# Patient Record
Sex: Male | Born: 1935 | Race: White | Hispanic: No | Marital: Married | Smoking: Never smoker
Health system: Southern US, Community
[De-identification: ages and names within clinical notes are randomized; demographics above are authoritative.]

## PROBLEM LIST (undated history)

## (undated) DIAGNOSIS — M19079 Primary osteoarthritis, unspecified ankle and foot: Secondary | ICD-10-CM

## (undated) DIAGNOSIS — G250 Essential tremor: Secondary | ICD-10-CM

## (undated) DIAGNOSIS — F419 Anxiety disorder, unspecified: Secondary | ICD-10-CM

## (undated) DIAGNOSIS — I2510 Atherosclerotic heart disease of native coronary artery without angina pectoris: Secondary | ICD-10-CM

## (undated) DIAGNOSIS — E785 Hyperlipidemia, unspecified: Secondary | ICD-10-CM

## (undated) DIAGNOSIS — G20 Parkinson's disease: Secondary | ICD-10-CM

## (undated) DIAGNOSIS — N400 Enlarged prostate without lower urinary tract symptoms: Secondary | ICD-10-CM

## (undated) DIAGNOSIS — M810 Age-related osteoporosis without current pathological fracture: Secondary | ICD-10-CM

## (undated) DIAGNOSIS — G20A1 Parkinson's disease without dyskinesia, without mention of fluctuations: Secondary | ICD-10-CM

## (undated) HISTORY — DX: Essential tremor: G25.0

## (undated) HISTORY — DX: Benign prostatic hyperplasia without lower urinary tract symptoms: N40.0

## (undated) HISTORY — DX: Hyperlipidemia, unspecified: E78.5

## (undated) HISTORY — PX: TONSILLECTOMY AND ADENOIDECTOMY: SUR1326

## (undated) HISTORY — PX: CARPAL TUNNEL RELEASE: SHX101

## (undated) HISTORY — DX: Anxiety disorder, unspecified: F41.9

## (undated) HISTORY — DX: Atherosclerotic heart disease of native coronary artery without angina pectoris: I25.10

## (undated) HISTORY — PX: CATARACT EXTRACTION: SUR2

## (undated) HISTORY — DX: Parkinson's disease without dyskinesia, without mention of fluctuations: G20.A1

## (undated) HISTORY — PX: CHOLECYSTECTOMY: SHX55

## (undated) HISTORY — DX: Age-related osteoporosis without current pathological fracture: M81.0

## (undated) HISTORY — DX: Primary osteoarthritis, unspecified ankle and foot: M19.079

## (undated) HISTORY — DX: Parkinson's disease: G20

---

## 1976-07-19 HISTORY — PX: OTHER SURGICAL HISTORY: SHX169

## 1997-07-19 HISTORY — PX: JOINT REPLACEMENT: SHX530

## 2006-12-27 ENCOUNTER — Ambulatory Visit: Payer: Self-pay | Admitting: Internal Medicine

## 2006-12-27 ENCOUNTER — Other Ambulatory Visit: Payer: Self-pay

## 2007-05-28 ENCOUNTER — Other Ambulatory Visit: Payer: Self-pay

## 2007-05-28 ENCOUNTER — Inpatient Hospital Stay: Payer: Self-pay | Admitting: Internal Medicine

## 2007-06-06 ENCOUNTER — Ambulatory Visit: Payer: Self-pay | Admitting: General Surgery

## 2007-06-08 ENCOUNTER — Ambulatory Visit: Payer: Self-pay | Admitting: General Surgery

## 2007-11-18 ENCOUNTER — Emergency Department: Payer: Self-pay | Admitting: Emergency Medicine

## 2008-07-19 DIAGNOSIS — I2510 Atherosclerotic heart disease of native coronary artery without angina pectoris: Secondary | ICD-10-CM

## 2008-07-19 HISTORY — DX: Atherosclerotic heart disease of native coronary artery without angina pectoris: I25.10

## 2008-09-16 HISTORY — PX: CORONARY STENT PLACEMENT: SHX1402

## 2009-02-26 ENCOUNTER — Ambulatory Visit: Payer: Self-pay | Admitting: Internal Medicine

## 2009-09-13 ENCOUNTER — Emergency Department: Payer: Self-pay | Admitting: Emergency Medicine

## 2010-07-19 DIAGNOSIS — M810 Age-related osteoporosis without current pathological fracture: Secondary | ICD-10-CM

## 2010-07-19 HISTORY — DX: Age-related osteoporosis without current pathological fracture: M81.0

## 2012-03-17 ENCOUNTER — Ambulatory Visit (INDEPENDENT_AMBULATORY_CARE_PROVIDER_SITE_OTHER): Payer: Medicare Other | Admitting: Internal Medicine

## 2012-03-17 ENCOUNTER — Encounter: Payer: Self-pay | Admitting: Internal Medicine

## 2012-03-17 VITALS — BP 128/70 | HR 68 | Temp 98.4°F | Ht 66.5 in | Wt 173.0 lb

## 2012-03-17 DIAGNOSIS — M79609 Pain in unspecified limb: Secondary | ICD-10-CM

## 2012-03-17 DIAGNOSIS — E785 Hyperlipidemia, unspecified: Secondary | ICD-10-CM | POA: Insufficient documentation

## 2012-03-17 DIAGNOSIS — N400 Enlarged prostate without lower urinary tract symptoms: Secondary | ICD-10-CM | POA: Insufficient documentation

## 2012-03-17 DIAGNOSIS — M79672 Pain in left foot: Secondary | ICD-10-CM | POA: Insufficient documentation

## 2012-03-17 DIAGNOSIS — M19079 Primary osteoarthritis, unspecified ankle and foot: Secondary | ICD-10-CM | POA: Insufficient documentation

## 2012-03-17 DIAGNOSIS — F411 Generalized anxiety disorder: Secondary | ICD-10-CM

## 2012-03-17 DIAGNOSIS — F419 Anxiety disorder, unspecified: Secondary | ICD-10-CM | POA: Insufficient documentation

## 2012-03-17 DIAGNOSIS — Z23 Encounter for immunization: Secondary | ICD-10-CM

## 2012-03-17 DIAGNOSIS — I2510 Atherosclerotic heart disease of native coronary artery without angina pectoris: Secondary | ICD-10-CM | POA: Insufficient documentation

## 2012-03-17 DIAGNOSIS — G20 Parkinson's disease: Secondary | ICD-10-CM

## 2012-03-17 NOTE — Assessment & Plan Note (Signed)
On meds Will check his records before doing any labs

## 2012-03-17 NOTE — Progress Notes (Signed)
Subjective:    Patient ID: [name] Rich, male    DOB: February 27, 1929, 78 y.o.   MRN: [id number]  HPI Establishing with me Wife recently started in my care Had been seeing Dr Terance Hart Is [facility] resident  Parkinson's disease diagnosed ~2001 Sees Dr Kennyth Arnold at [facility] to manage all personal care and some help in house  Now limited in activity by problems with left foot problems Podiatrist wanted to operate but he deferred---diagnosed with arthrits (Dr Charlsie Merles) Pain with walking--now going for PT Has celebrex but hasn't been taking---concerned about side effects Has been able to use the stationary bike at [facility]  Has CAD Diagnosed 8 years ago or so Cath done 3/10 with stent. Follow up 8/10 showed stability Dr Gwen Pounds follows Stable exertional angina though ranexa does help No CHF  Has BPH since before 10 years ago Has been on terazosin all that time Occ dizziness and sleepiness No syncope but concerned about hypotension Isosorbide has been decreased due to concerns about low BP Can be as low as 90/46 in AM  Has been on venlafaxine since Parkinson's diagnosis He doesn't feel that he is really depressed Original treating doctor felt he probably would have anxiety Now doesn't take it over once a week Labile emotions in past with easy crying but not depressed  Rx for high cholesterol No problems with the med  Current Outpatient Prescriptions on File Prior to Visit  Medication Sig Dispense Refill  . carbidopa-levodopa (SINEMET CR) 25-100 MG per tablet Take 1.5 tablets by mouth 4 (four) times daily.      . entacapone (COMTAN) 200 MG tablet Take 200 mg by mouth 4 (four) times daily.      . isosorbide dinitrate (ISORDIL) 30 MG tablet Take 30 mg by mouth once a week.      . lovastatin (MEVACOR) 40 MG tablet Take 20 mg by mouth at bedtime.      . nitroGLYCERIN (NITROSTAT) 0.4 MG SL tablet Place 0.4 mg under the tongue every 5 (five) minutes as needed.      . pramipexole  (MIRAPEX) 0.25 MG tablet Take 0.75 mg by mouth 4 (four) times daily.      . ranolazine (RANEXA) 500 MG 12 hr tablet Take 500 mg by mouth 2 (two) times daily.      Marland Kitchen terazosin (HYTRIN) 5 MG capsule Take 5 mg by mouth at bedtime.      Marland Kitchen venlafaxine XR (EFFEXOR-XR) 75 MG 24 hr capsule Take 75 mg by mouth daily.        No Known Allergies  Past Medical History  Diagnosis Date  . Hyperlipidemia   . Coronary artery disease 2010    Dr Gwen Pounds  . Anxiety   . Osteoarthritis of foot   . Parkinson's disease     Dr Kennyth Arnold at [facility]    Past Surgical History  Procedure Date  . Coronary stent placement 3/10  . Cholecystectomy   . Tonsillectomy and adenoidectomy childhood  . Lumbosacral fusion 1978  . Joint replacement 1999    Left knee TKR    Family History  Problem Relation Age of Onset  . Heart disease Mother   . Stroke Father   . Diabetes Father   . Cancer Brother   . Heart disease Brother   . Hypertension Brother   . Diabetes Brother     History   Social History  . Marital Status: Married    Spouse Name: N/A    Number of Children: 5  .  Years of Education: N/A   Occupational History  . Retired Insurance underwriter    Social History Main Topics  . Smoking status: Never Smoker   . Smokeless tobacco: Never Used  . Alcohol Use: Yes  . Drug Use: No  . Sexually Active: Not on file   Other Topics Concern  . Not on file   Social History Narrative   Daughter in [place]. 4 other children ([place], [place], [place], [place] to [employer] living willWife is health care POA (then daughter [name])Would accept resuscitation attemptsProbably would not want tube feeds if cognitively unaware   Review of Systems  Constitutional: Negative for fatigue and unexpected weight change.       Has lost a few pounds in the past year or so---trying to be more careful  HENT: Positive for dental problem. Negative for hearing loss, congestion and rhinorrhea.         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       Sees Dr Judie Grieve for eyes--?[facility] going---needs cataracts done  Respiratory: Positive for chest tightness and shortness of breath. Negative for cough.   Cardiovascular: Positive for chest pain.  Gastrointestinal: Positive for constipation. Negative for nausea and vomiting.       Heartburn Did have impaction in 2011 Uses stool softener daily  Genitourinary: Positive for difficulty urinating. Negative for frequency.       Nocturia is rare now  Musculoskeletal: Positive for back pain and arthralgias. Negative for joint swelling.  Skin:       Sees Dr Gwen Pounds regularly---has had some excisions  Neurological: Positive for dizziness and light-headedness. Negative for syncope and headaches.  Psychiatric/Behavioral: Negative for disturbed wake/sleep cycle and dysphoric mood. The patient is not nervous/anxious.        Sleeps up to 12 hours per day       Objective:   Physical Exam  Constitutional: He is oriented to person, place, and time. He appears well-developed and well-nourished. No distress.  HENT:  Mouth/Throat: Oropharynx is clear and moist. No oropharyngeal exudate.  Eyes: Conjunctivae and EOM are normal. Pupils are equal, round, and reactive to light.  Neck: Normal range of motion. Neck supple. No thyromegaly present.  Cardiovascular: Normal rate and regular rhythm.  Exam reveals no gallop.   Murmur heard.      Gr 2/6 coarse systolic murmur best heard in tricuspid area Faint pedal pulses  Pulmonary/Chest: Effort normal and breath sounds normal. No respiratory distress. He has no wheezes. He has no rales.  Abdominal: Soft. There is no tenderness.  Musculoskeletal: He exhibits no edema.       Left foot is not tender overpronation and decreased arch compared to right  Lymphadenopathy:    He has no cervical adenopathy.  Neurological: He is  alert and oriented to person, place, and time. He exhibits normal muscle tone. Coordination normal.       Mild resting tremor No sig bradykinesia Fairly normal tone Gait is not shuffling but small steps  Skin: No rash noted. No erythema.  Psychiatric: He has a normal mood and affect. His behavior is normal.          Assessment & Plan:

## 2012-03-17 NOTE — Assessment & Plan Note (Signed)
Fairly mild functional impairment given his length of illness Dr Kennyth Arnold manages him

## 2012-03-17 NOTE — Assessment & Plan Note (Signed)
Chronic fairly stable angina Will continue with Dr Gwen Pounds

## 2012-03-17 NOTE — Assessment & Plan Note (Signed)
Getting PT Seems like there may be a mechanical component Will set up eval by Dr Patsy Lager

## 2012-03-17 NOTE — Assessment & Plan Note (Signed)
Doesn't really seem to be an active issue Will stop the venlafaxine

## 2012-03-17 NOTE — Patient Instructions (Signed)
Please get last 4 years of records from Dr Terance Hart

## 2012-03-17 NOTE — Assessment & Plan Note (Addendum)
Has done well in symptom control on doxazosin but has dizziness issues Given the Parkinson's with increased orthostatic risk, may want to change this to flomax Consider change to finasteride if dizziness persists on either Dr Terance Hart had him on another med and then went back to doxazosin---will await his records before making a decision

## 2012-05-04 ENCOUNTER — Telehealth: Payer: Self-pay | Admitting: Internal Medicine

## 2012-05-04 NOTE — Telephone Encounter (Signed)
Dr.Letvak pt dropped off this DMV form, do you need the other pages to the form? Like the codes that give the reason why it needs to be filled out?  Form on your desk

## 2012-05-04 NOTE — Telephone Encounter (Signed)
Pt dropped off Medical form to be filled out. Put it on your desk.

## 2012-05-04 NOTE — Telephone Encounter (Signed)
Yes That is only one page of the 8 page (or so) packet He needs to get the whole thing to me and explain what their concern is

## 2012-05-05 NOTE — Telephone Encounter (Signed)
Spoke with patient and he will try and get the DMV to send him another. Pt states that was the only page missing from the previous packet that another physician filled out.

## 2012-05-08 ENCOUNTER — Encounter: Payer: Self-pay | Admitting: Family Medicine

## 2012-05-08 ENCOUNTER — Ambulatory Visit (INDEPENDENT_AMBULATORY_CARE_PROVIDER_SITE_OTHER): Payer: Medicare Other | Admitting: Family Medicine

## 2012-05-08 VITALS — BP 118/64 | HR 68 | Temp 97.7°F | Wt 174.8 lb

## 2012-05-08 DIAGNOSIS — M19079 Primary osteoarthritis, unspecified ankle and foot: Secondary | ICD-10-CM

## 2012-05-08 DIAGNOSIS — M79609 Pain in unspecified limb: Secondary | ICD-10-CM

## 2012-05-08 DIAGNOSIS — M2022 Hallux rigidus, left foot: Secondary | ICD-10-CM

## 2012-05-08 DIAGNOSIS — M2020 Hallux rigidus, unspecified foot: Secondary | ICD-10-CM

## 2012-05-08 DIAGNOSIS — M79672 Pain in left foot: Secondary | ICD-10-CM

## 2012-05-08 NOTE — Progress Notes (Signed)
[facility] at [facility] [address] [place] [address] Phone: [phone] Fax: [phone]  Date:  05/08/2012   Name:  Ausencio Vaden   DOB:  09/29/1928   MRN:  [id number] Gender: male Age: 78 y.o.  PCP:  [name], MD  Evaluating MD: Hannah Beat, MD   Chief Complaint: Leg Pain   History of Present Illness:  Mccoy Testa is a 78 y.o. pleasant patient who presents with the following:  Dr. Alphonsus Sias asked me to evaluate the patient for Left foot pain:  Worst pain is on the big toe and also having some pain in the the left posterior buttocks. Left foot. Also primarily pain in the great toe. Dropped a steel pipe on foot distantly as a young man, and per his wife he is been complaining about that for well more than 50 years.  He has some pain in the foot and ankle, but his greatest complaint is at the 1st MTP. He denies a history of gout or pseudogout. He does have Parkinson's disease, which contributes when he has tremors in his feet and causes some pain.  He does have some Celebrex, but he has been reluctant to take it, and he does often take some Tylenol.  Patient Active Problem List  Diagnosis  . Anxiety  . Hyperlipidemia  . Coronary artery disease  . Osteoarthritis of foot  . Parkinson's disease  . Foot pain, left  . BPH (benign prostatic hypertrophy)    Past Medical History  Diagnosis Date  . Hyperlipidemia   . Coronary artery disease 2010    Dr Gwen Pounds  . Anxiety   . Osteoarthritis of foot   . Parkinson's disease     Dr Kennyth Arnold at [facility]  . BPH (benign prostatic hypertrophy)     Past Surgical History  Procedure Date  . Coronary stent placement 3/10  . Cholecystectomy   . Tonsillectomy and adenoidectomy childhood  . Lumbosacral fusion 1978  . Joint replacement 1999    Left knee TKR    History  Substance Use Topics  . Smoking status: Never Smoker   . Smokeless tobacco: Never Used  . Alcohol Use: Yes     very little    Family  History  Problem Relation Age of Onset  . Heart disease Mother   . Stroke Father   . Diabetes Father   . Cancer Brother   . Heart disease Brother   . Hypertension Brother   . Diabetes Brother     Allergies  Allergen Reactions  . Morphine And Related     Passed out in hospital    Medication list has been reviewed and updated.  Outpatient Prescriptions Prior to Visit  Medication Sig Dispense Refill  . aspirin 81 MG tablet Take 81 mg by mouth daily.      . carbidopa-levodopa (SINEMET CR) 25-100 MG per tablet Take 1.5 tablets by mouth 4 (four) times daily.      . cholecalciferol (VITAMIN D) 1000 UNITS tablet Take 1,000 Units by mouth daily.      Marland Kitchen docusate sodium (COLACE) 100 MG capsule Take 100 mg by mouth 2 (two) times daily.      . entacapone (COMTAN) 200 MG tablet Take 200 mg by mouth 4 (four) times daily.      . isosorbide dinitrate (ISORDIL) 30 MG tablet Take 30 mg by mouth once a week.      . lovastatin (MEVACOR) 40 MG tablet Take 20 mg by mouth at bedtime.      Marland Kitchen  nitroGLYCERIN (NITROSTAT) 0.4 MG SL tablet Place 0.4 mg under the tongue every 5 (five) minutes as needed.      . pramipexole (MIRAPEX) 0.25 MG tablet Take 0.75 mg by mouth 4 (four) times daily.      . ranolazine (RANEXA) 500 MG 12 hr tablet Take 500 mg by mouth 2 (two) times daily.      Marland Kitchen terazosin (HYTRIN) 5 MG capsule Take 5 mg by mouth at bedtime.        Review of Systems:  Active Parkinson's disease. Some LEFT hip pain as well. Foot pain as described. Otherwise review systems is negative.  Physical Examination: Filed Vitals:   05/08/12 0851  BP: 118/64  Pulse: 68  Temp: 97.7 F (36.5 C)  TempSrc: Oral  Weight: 174 lb 12 oz (79.266 kg)    There is no height on file to calculate BMI. Ideal Body Weight:     GEN: WDWN, NAD, Non-toxic, Alert & Oriented x 3 HEENT: Atraumatic, Normocephalic.  Ears and Nose: No external deformity. EXTR: No clubbing/cyanosis/edema NEURO: tremor noted extr PSYCH:  Normally interactive. Conversant. Not depressed or anxious appearing.  Calm demeanor.   Bilateral foot and ankle: Nontender along the distal tibia and fibula. Minimally tender at the true ankle. Nontender at the ATFL and see if the ligaments. Nontender at the deltoid ligaments. Nontender along the perineal and posterior tibialis tendons. Nontender along the Achilles. Nontender at the plantar fascia. Nontender along the entirety of the midfoot.  Nontender at the navicular. Nontender at the base of 5th metatarsal.  Patient does have some significant tenderness between the shafts of the 1st and 2nd metatarsal as well as the 2nd and 3rd metatarsal. Painful with squeeze.  Notable bunion, LEFT greater than RIGHT. Effectively zero range of motion at the 1st metatarsal phalangeal joint on the LEFT and possibly 5-10 of motion on the RIGHT. All of which calls him significant amounts of pain. He does have dropped metatarsal heads throughout on both feet. As well  Assessment and Plan:  1. Hallux rigidus of left foot   2. Foot pain, left   3. Arthritis of foot    >25 minutes spent in face to face time with patient, >50% spent in counselling or coordination of care: this is a challenging case, and the patient has severe hallux rigidus and effectively has diffuse joint on his LEFT MTP. This is notably painful for him and he has a lack of motion which is going to alter his gait and is likely contributing to some of his posterior head pain as well.  This is going to be a challenging case, and he will not be able to achieve normal motion at these joints. He can stretch the capsule and potentially increase motion but maybe 20-30%. Operative intervention has been discussed with him by one of the podiatrists, which certainly is an option, but I reviewed the extent of the operation with him and advised and cautioned given some of his medical comorbidities.  For now, I gave him an off the shelf orthotic and placed a 1st  ray post of Poron, which I hope will take some stress off the MTP joint.  Orders Today:  No orders of the defined types were placed in this encounter.    Updated Medication List: (Includes new medications, updates to list, dose adjustments) Meds ordered this encounter  Medications  . venlafaxine (EFFEXOR) 75 MG tablet    Sig: Take 75 mg by mouth daily.    Medications Discontinued:  There are no discontinued medications.   Hannah Beat, MD

## 2012-05-09 DIAGNOSIS — M2022 Hallux rigidus, left foot: Secondary | ICD-10-CM | POA: Insufficient documentation

## 2012-05-11 DIAGNOSIS — Z0279 Encounter for issue of other medical certificate: Secondary | ICD-10-CM

## 2012-07-24 ENCOUNTER — Ambulatory Visit (INDEPENDENT_AMBULATORY_CARE_PROVIDER_SITE_OTHER): Payer: Medicare Other | Admitting: Internal Medicine

## 2012-07-24 ENCOUNTER — Encounter: Payer: Self-pay | Admitting: Internal Medicine

## 2012-07-24 VITALS — BP 122/80 | HR 68 | Temp 98.1°F | Wt 177.0 lb

## 2012-07-24 DIAGNOSIS — M79672 Pain in left foot: Secondary | ICD-10-CM

## 2012-07-24 DIAGNOSIS — M79609 Pain in unspecified limb: Secondary | ICD-10-CM

## 2012-07-24 DIAGNOSIS — N400 Enlarged prostate without lower urinary tract symptoms: Secondary | ICD-10-CM

## 2012-07-24 NOTE — Patient Instructions (Signed)
You can take 1-2 advil up to three times a day for foot pain when it is bad If that isn't effective, call for a stronger medication

## 2012-07-24 NOTE — Assessment & Plan Note (Signed)
Has noticed a mild increase in urinary problems Will consider adding finasteride

## 2012-07-24 NOTE — Progress Notes (Signed)
Subjective:    Patient ID: [name] Rich, male    DOB: 08/15/1928, 79 y.o.   MRN: [id number]  HPI Here with wife Left foot pain got really bad about a week ago----would have considered surgery Sharp pain when taking a step  No known recent injury Thinks it is worse due to his Parkinson's Did try the orthotic from Dr Lindie Spruce think it helped much Did try elastic foot brace recently--may have helped some  Has tried advil 200mg  when pain was bad--it does help  Current Outpatient Prescriptions on File Prior to Visit  Medication Sig Dispense Refill  . aspirin 81 MG tablet Take 81 mg by mouth daily.      . carbidopa-levodopa (SINEMET CR) 25-100 MG per tablet Take 1.5 tablets by mouth 4 (four) times daily.      . cholecalciferol (VITAMIN D) 1000 UNITS tablet Take 1,000 Units by mouth daily.      Marland Kitchen docusate sodium (COLACE) 100 MG capsule Take 100 mg by mouth 2 (two) times daily.      . entacapone (COMTAN) 200 MG tablet Take 200 mg by mouth 4 (four) times daily.      . isosorbide dinitrate (ISORDIL) 30 MG tablet Take 30 mg by mouth once a week.      . lovastatin (MEVACOR) 40 MG tablet Take 20 mg by mouth at bedtime.      . nitroGLYCERIN (NITROSTAT) 0.4 MG SL tablet Place 0.4 mg under the tongue every 5 (five) minutes as needed.      . pramipexole (MIRAPEX) 0.25 MG tablet Take 0.75 mg by mouth 4 (four) times daily.      . ranolazine (RANEXA) 500 MG 12 hr tablet Take 500 mg by mouth 2 (two) times daily.      Marland Kitchen terazosin (HYTRIN) 5 MG capsule Take 5 mg by mouth at bedtime.      Marland Kitchen venlafaxine (EFFEXOR) 75 MG tablet Take 75 mg by mouth daily.        Allergies  Allergen Reactions  . Morphine And Related     Passed out in hospital    Past Medical History  Diagnosis Date  . Hyperlipidemia   . Coronary artery disease 2010    Dr Gwen Pounds  . Anxiety   . Osteoarthritis of foot   . Parkinson's disease     Dr Kennyth Arnold at [facility]  . BPH (benign prostatic hypertrophy)     Past Surgical  History  Procedure Date  . Coronary stent placement 3/10  . Cholecystectomy   . Tonsillectomy and adenoidectomy childhood  . Lumbosacral fusion 1978  . Joint replacement 1999    Left knee TKR    Family History  Problem Relation Age of Onset  . Heart disease Mother   . Stroke Father   . Diabetes Father   . Cancer Brother   . Heart disease Brother   . Hypertension Brother   . Diabetes Brother     History   Social History  . Marital Status: Married    Spouse Name: N/A    Number of Children: 5  . Years of Education: N/A   Occupational History  . Retired Insurance underwriter    Social History Main Topics  . Smoking status: Never Smoker   . Smokeless tobacco: Never Used  . Alcohol Use: Yes     Comment: very little  . Drug Use: No  . Sexually Active: Not on file   Other Topics Concern  . Not on file  Social History Narrative   Daughter in [place]. 4 other children ([place], [place], [place], [place] to [employer] living willWife is health care POA (then daughter [name])Would accept resuscitation attemptsProbably would not want tube feeds if cognitively unaware   Review of Systems Some bilateral  calf swelling---will swell above sock if it is tight No fever Mild nausea yesterday ---better now     Objective:   Physical Exam  Musculoskeletal:       Slight edema in calves Pain spot is left 1st metacarpal from mid to distal but no tenderness now          Assessment & Plan:

## 2012-07-24 NOTE — Assessment & Plan Note (Signed)
Ongoing and mild generally Was severe last week for unknown reasons---seems better now Discussed increased ibuprofen dose Could consider narcotic also

## 2012-08-18 ENCOUNTER — Ambulatory Visit: Payer: Medicare Other | Admitting: Neurology

## 2012-08-22 ENCOUNTER — Encounter: Payer: Self-pay | Admitting: Neurology

## 2012-08-22 ENCOUNTER — Ambulatory Visit (INDEPENDENT_AMBULATORY_CARE_PROVIDER_SITE_OTHER): Payer: Medicare Other | Admitting: Neurology

## 2012-08-22 VITALS — BP 110/62 | HR 68 | Temp 97.8°F | Resp 16 | Wt 179.0 lb

## 2012-08-22 DIAGNOSIS — F09 Unspecified mental disorder due to known physiological condition: Secondary | ICD-10-CM

## 2012-08-22 DIAGNOSIS — G20 Parkinson's disease: Secondary | ICD-10-CM

## 2012-08-22 DIAGNOSIS — R4189 Other symptoms and signs involving cognitive functions and awareness: Secondary | ICD-10-CM

## 2012-08-22 DIAGNOSIS — I951 Orthostatic hypotension: Secondary | ICD-10-CM

## 2012-08-22 NOTE — Patient Instructions (Addendum)
Call us with your current medication doses. Call in two weeks to let us know how you're doing on the increased Sinemet (no more than six pills a day) We will contact you for your four month follow up.

## 2012-08-22 NOTE — Progress Notes (Signed)
[name] Rich was seen today in the movement disorders clinic for neurologic consultation at the request of [name] Abide, MD.  The consultation is for the evaluation of Parkinsons disease.  The patient has previously seen Dr. Misty Rich.  I do not have those records.  This patient is accompanied in the office by his spouse who supplements the history.  The patient reports that his first PD sx was when he was living in [name] perhaps in the year 2000.  He had L hand tremor.  He had a long hx of ET but this was a new resting tremor for him.  He was told he had PD.  He started on levodopa not long thereafter.  He did not think that the levodopa helped and he still is not sure that it helps.  However, he states that on one time he had stopped comtan and the tremor restarted.  He states that he restarted his comtan and within 20 min tremor was better.  He thinks that the mirapex was of benefit.    He takes levodopa 4 times per day.  He is on immediate release formulation.  He has no wearing off.    He has no freezing.  He does admit to frustration that tremor was much better controlled years ago.     Specific Symptoms:  Tremor: yes Voice: recently noted some trouble singing at church (less strength in voice) Sleep: sleeps well  Vivid Dreams:  no, not now  Acting out dreams:  no, did in the past but better now Wet Pillows: yes, occasionally Postural symptoms:  yes  Falls?  no (last fall 4-5 years ago, walking over a wall) Bradykinesia symptoms: difficulty with initiating movement Loss of smell:  yes Loss of taste:  yes Urinary Incontinence:  yes, but better with new medication (does not have to wear depends) Difficulty Swallowing:  no Handwriting, micrographia: yes but biggest problem is shakiness Trouble with ADL's:  yes  Trouble buttoning clothing: yes Depression:  no, but on antidepressant b/c of crying spells (5 years ago).  Takes effexor 2 times per month and that seems to help him. Memory  changes:  yes, trouble with names.  Wife reports pt has trouble with multitasking and noting more cognitive impairment than previously. Hallucinations:  yes but generally more of visual distortion  visual distortions: yes N/V:  no Lightheaded:  yes, rarely per pt but every AM per wife when gets OOB pt is lighthead with BP in 80's.  Syncope: no Diplopia:  no Dyskinesia:  no   PREVIOUS MEDICATIONS: Sinemet and Mirapex, comtan  ALLERGIES:   Allergies  Allergen Reactions  . Morphine And Related     Passed out in hospital  . Xylocaine-Epinephrine (Lidocaine-Epinephrine)     CURRENT MEDICATIONS:  Current Outpatient Prescriptions on File Prior to Visit  Medication Sig Dispense Refill  . aspirin 81 MG tablet Take 81 mg by mouth daily.      . carbidopa-levodopa (SINEMET CR) 25-100 MG per tablet Take 1.5 tablets by mouth 4 (four) times daily.      . cholecalciferol (VITAMIN D) 1000 UNITS tablet Take 1,000 Units by mouth daily. 50,000 weekly      . docusate sodium (COLACE) 100 MG capsule Take 100 mg by mouth 2 (two) times daily.      . entacapone (COMTAN) 200 MG tablet Take 200 mg by mouth 4 (four) times daily.      Marland Kitchen ibuprofen (ADVIL,MOTRIN) 200 MG tablet Take 200-400 mg by mouth 3 (three)  times daily as needed.      . isosorbide dinitrate (ISORDIL) 30 MG tablet Take 30 mg by mouth once a week.      . lovastatin (MEVACOR) 40 MG tablet Take 20 mg by mouth at bedtime.      . nitroGLYCERIN (NITROSTAT) 0.4 MG SL tablet Place 0.4 mg under the tongue every 5 (five) minutes as needed.      . pramipexole (MIRAPEX) 0.25 MG tablet Take 0.75 mg by mouth 4 (four) times daily.      . ranolazine (RANEXA) 500 MG 12 hr tablet Take 500 mg by mouth 2 (two) times daily.      Marland Kitchen terazosin (HYTRIN) 5 MG capsule Take 5 mg by mouth at bedtime.      Marland Kitchen venlafaxine (EFFEXOR) 75 MG tablet Take 75 mg by mouth daily.        PAST MEDICAL HISTORY:   Past Medical History  Diagnosis Date  . Hyperlipidemia   . Coronary  artery disease 2010    Dr Gwen Pounds  . Anxiety   . Osteoarthritis of foot   . Parkinson's disease     Dr Kennyth Arnold at [facility] - 2000  . BPH (benign prostatic hypertrophy)   . Essential tremor     PAST SURGICAL HISTORY:   Past Surgical History  Procedure Date  . Coronary stent placement 3/10  . Cholecystectomy   . Tonsillectomy and adenoidectomy childhood  . Lumbosacral fusion 1978  . Joint replacement 1999    Left knee TKR  . Carpal tunnel release     left  . Cataract extraction     right    SOCIAL HISTORY:   History   Social History  . Marital Status: Married    Spouse Name: N/A    Number of Children: 5  . Years of Education: N/A   Occupational History  . Retired Insurance underwriter    Social History Main Topics  . Smoking status: Never Smoker   . Smokeless tobacco: Never Used  . Alcohol Use: Yes     Comment: very little  . Drug Use: No  . Sexually Active: Not on file   Other Topics Concern  . Not on file   Social History Narrative   Daughter in [place]. 4 other children ([place], [place], [place], [place] to [employer] living willWife is health care POA (then daughter [name])Would accept resuscitation attemptsProbably would not want tube feeds if cognitively unaware    FAMILY HISTORY:   Family Status  Relation Status Death Age  . Mother Deceased 47    CHF  . Father Deceased 57    MI, ET  . Brother Deceased     ?liver cancer  . Brother Alive     DM-2, CAD  . Child Alive     5, alive and well    ROS:  A complete 10 system review of systems was obtained and was unremarkable apart from what is mentioned above.  PHYSICAL EXAMINATION:    VITALS:   Filed Vitals:   08/22/12 1322  BP: 110/62  Pulse: 68  Temp: 97.8 F (36.6 C)  Resp: 16  Weight: 179 lb (81.194 kg)    GEN:  The patient appears stated age and is in NAD. HEENT:  Normocephalic, atraumatic.  The mucous membranes are moist. The superficial temporal arteries are  without ropiness or tenderness. CV:  RRR with 3/6 SEM Lungs:  CTAB Neck/HEME:  There are no carotid bruits bilaterally.  Neurological examination:  Orientation: The  patient is alert and oriented x3. Fund of knowledge is appropriate.  Recent and remote memory are intact.  Attention and concentration are normal.    Able to name objects and repeat phrases.  He relies on his wife for clarification of some of the historical events. Cranial nerves: There is good facial symmetry. Pupils are equal round and reactive to light bilaterally. Fundoscopic exam reveals clear margins bilaterally. Extraocular muscles are intact. The visual fields are full to confrontational testing. The speech is fluent and clear. Soft palate rises symmetrically and there is no tongue deviation. Hearing is intact to conversational tone. Sensation: Sensation is intact to light and pinprick throughout (facial, trunk, extremities). Vibration is intact at the bilateral big toe. There is no consistent extinction with double simultaneous stimulation. There is no sensory dermatomal level identified. Motor: Strength is 5/5 in the bilateral upper and lower extremities.   Shoulder shrug is equal and symmetric.  There is no pronator drift. Deep tendon reflexes: Deep tendon reflexes are 2/4 at the bilateral biceps, triceps, brachioradialis, patella and 1/4 at the bilateral achilles. Plantar responses are downgoing bilaterally.  Movement examination: Tone: There is increased tone in the RUE. Abnormal movements: UE resting tremor is present bilaterally, L>R and in the L foot. Coordination:  There is mild decremation with RAM's, seen more on the L than R.  Gait and Station: The patient has minimal difficulty arising out of a deep-seated chair without the use of the hands. The patient's stride length is slight decreased.  The patient has a negative pull test.      ASSESSMENT/PLAN:  1.  ET/PD  -I would like to get records from Dr. Kennyth Arnold   -I  think that the pt is underdosed. I would like to see him at least take his levodopa more often throughout the day (perhaps 6 AM, 9 AM, noon, 3 PM and 6 PM).  He is going to try that and let me know in a week or 2 how he is doing.  I told him that he can go up to a total of 600 mg of levodopa per day (he is currently on 400 mg per day) but I want him to call me if he does be on that.  For now, he would like to try just taking half tablet of the 25/100 carbidopa/levodopa throughout the day to make a total of 400 mg.  It is fine with me, but I ultimately think that we will need to increase the dosage.  -I talked to the patient about the fact that I think we are going to eventually need to reduce the dosage of Mirapex, primarily because of cognitive impairment and visual distortion/hallucinations.  He does not wish to do that right now.  It would be very important, however, for him to get me his current Mirapex dosage (he wasn't sure this and gets his medication at the [place] where we were unable to call).  He is also to get me whether or not he is on the extended release or immediate release form of the carbidopa/levodopa.  -I very much would like to see the patient do some physical therapy.  He did not want to do that right now.  We talked about the importance of exercise, but I told him I would not want him to do cardiovascular exercise without the approval of his cardiologist.  I think he is safe to do some physical therapy, but he asked me to hold on that.  -The patient asked  me about alternatives, including DBS therapy and stem cell therapy.  I discussed with him that stem cell therapy is not FDA approved.  We talked about the fact that he would need neurocognitive testing before he would be considered a DBS candidate, and would need surgical clearance.  I am not sure that cognitively he is a DBS candidate, but we certainly could put him through the testing.  He did not wish to proceed. 2.  cognitive impairment,  likely related to Parkinson's.  -We talked about the cognitive disorders associated with Parkinson's disease.  Talked about the fact that medication, especially Mirapex, can exacerbate this.  We talked about the fact that Exelon is indicated for this, but he did not want to try it.  -a MoCA will be done next visit. 3.  probable orthostatic hypotension.  -Again, this is likely related to Parkinson's.  This has not become a big problem.  We talked about the importance of sitting on the bed for a few minutes before he gets out of it in the morning.  If it becomes a bigger problem, we are likely going to have to address his medication. 4.  I will plan on seeing him back in the next 4 months, sooner should new neurologic issues arise.  Time in the room with the patient today was 80 minutes.

## 2012-09-20 ENCOUNTER — Ambulatory Visit: Payer: Medicare Other | Admitting: Internal Medicine

## 2012-10-10 ENCOUNTER — Ambulatory Visit: Payer: Medicare Other | Admitting: Internal Medicine

## 2012-10-13 ENCOUNTER — Ambulatory Visit (INDEPENDENT_AMBULATORY_CARE_PROVIDER_SITE_OTHER): Payer: Medicare Other | Admitting: Internal Medicine

## 2012-10-13 ENCOUNTER — Encounter: Payer: Self-pay | Admitting: Internal Medicine

## 2012-10-13 VITALS — BP 122/70 | HR 68 | Temp 97.7°F | Wt 175.0 lb

## 2012-10-13 DIAGNOSIS — R42 Dizziness and giddiness: Secondary | ICD-10-CM

## 2012-10-13 DIAGNOSIS — E785 Hyperlipidemia, unspecified: Secondary | ICD-10-CM

## 2012-10-13 DIAGNOSIS — F411 Generalized anxiety disorder: Secondary | ICD-10-CM

## 2012-10-13 DIAGNOSIS — N400 Enlarged prostate without lower urinary tract symptoms: Secondary | ICD-10-CM

## 2012-10-13 DIAGNOSIS — G20 Parkinson's disease: Secondary | ICD-10-CM

## 2012-10-13 DIAGNOSIS — I2510 Atherosclerotic heart disease of native coronary artery without angina pectoris: Secondary | ICD-10-CM

## 2012-10-13 DIAGNOSIS — F419 Anxiety disorder, unspecified: Secondary | ICD-10-CM

## 2012-10-13 LAB — BASIC METABOLIC PANEL
CO2: 28 mEq/L (ref 19–32)
Chloride: 100 mEq/L (ref 96–112)
Creatinine, Ser: 1.3 mg/dL (ref 0.4–1.5)
Potassium: 4.1 mEq/L (ref 3.5–5.1)

## 2012-10-13 LAB — CBC WITH DIFFERENTIAL/PLATELET
Basophils Relative: 0.8 % (ref 0.0–3.0)
Eosinophils Absolute: 0 10*3/uL (ref 0.0–0.7)
Eosinophils Relative: 0.6 % (ref 0.0–5.0)
HCT: 39.4 % (ref 39.0–52.0)
Hemoglobin: 13.4 g/dL (ref 13.0–17.0)
Lymphs Abs: 1.2 10*3/uL (ref 0.7–4.0)
MCHC: 33.9 g/dL (ref 30.0–36.0)
MCV: 94.1 fl (ref 78.0–100.0)
Monocytes Absolute: 0.5 10*3/uL (ref 0.1–1.0)
Neutro Abs: 3.7 10*3/uL (ref 1.4–7.7)
Neutrophils Relative %: 67 % (ref 43.0–77.0)
RBC: 4.19 Mil/uL — ABNORMAL LOW (ref 4.22–5.81)

## 2012-10-13 LAB — HEPATIC FUNCTION PANEL
ALT: 9 U/L (ref 0–53)
AST: 17 U/L (ref 0–37)
Albumin: 4.2 g/dL (ref 3.5–5.2)
Total Protein: 7 g/dL (ref 6.0–8.3)

## 2012-10-13 LAB — LIPID PANEL
Total CHOL/HDL Ratio: 4
Triglycerides: 117 mg/dL (ref 0.0–149.0)

## 2012-10-13 MED ORDER — TAMSULOSIN HCL 0.4 MG PO CAPS: 0.4000 mg | ORAL_CAPSULE | Freq: Every day | ORAL | Status: DC

## 2012-10-13 NOTE — Assessment & Plan Note (Signed)
Will try changing to tamsulosin

## 2012-10-13 NOTE — Assessment & Plan Note (Signed)
Seems to have chronic ongoing anginal and dyspnea as anginal equivalent Still, if dizziness persists, I would recommend he try off the isosorbide and see how he does

## 2012-10-13 NOTE — Patient Instructions (Signed)
Stop terazosin and take tamsulosin instead (for your prostate)  After a week, if the dizziness persists, please try without the isosorbide---to see if that helps. Go back on if your chest pain worsens.

## 2012-10-13 NOTE — Assessment & Plan Note (Signed)
This is his biggest concern Not really orthostatic ---the type that is persistent Will try changing terazosin to tamsulosin If persists, would recommend stopping the isosorbide

## 2012-10-13 NOTE — Progress Notes (Signed)
Subjective:    Patient ID: [name] Rich, male    DOB: Nov 24, 1928, 79 y.o.   MRN: [id number]  HPI Here with wife Impressed with first visit with Dr Raye Sorrow thorough  Still having foot pain Using OTC neuropathy cream that has given some relief  Uses ibuprofen occasionally Thinks it helps his back and dizziness Discussed limiting use for cardiac safety  No chest pain No SOB Slight edema No syncope but does get dizziness QOL most affected by his prolonged dizziness at times  Voids okay Mild leakage--uses toilet paper as pad Nocturia only 1-2  Still gets anxiety---mostly when he has dizzy spells Gets down with this Not anhedonic---enjoys bridge, etc  Current Outpatient Prescriptions on File Prior to Visit  Medication Sig Dispense Refill  . aspirin 81 MG tablet Take 81 mg by mouth daily.      . carbidopa-levodopa (SINEMET CR) 25-100 MG per tablet Take 1.5 tablets by mouth 4 (four) times daily.      . cholecalciferol (VITAMIN D) 1000 UNITS tablet Take 1,000 Units by mouth daily. 50,000 weekly      . docusate sodium (COLACE) 100 MG capsule Take 100 mg by mouth 2 (two) times daily.      . entacapone (COMTAN) 200 MG tablet Take 200 mg by mouth 4 (four) times daily.      Marland Kitchen ibuprofen (ADVIL,MOTRIN) 200 MG tablet Take 200-400 mg by mouth 3 (three) times daily as needed.      . isosorbide dinitrate (ISORDIL) 30 MG tablet Take 30 mg by mouth once a week.      . lovastatin (MEVACOR) 40 MG tablet Take 20 mg by mouth at bedtime.      . nitroGLYCERIN (NITROSTAT) 0.4 MG SL tablet Place 0.4 mg under the tongue every 5 (five) minutes as needed.      . pramipexole (MIRAPEX) 0.25 MG tablet Take 0.75 mg by mouth 4 (four) times daily.      . ranolazine (RANEXA) 500 MG 12 hr tablet Take 500 mg by mouth 2 (two) times daily.      Marland Kitchen terazosin (HYTRIN) 5 MG capsule Take 5 mg by mouth at bedtime.      Marland Kitchen venlafaxine (EFFEXOR) 75 MG tablet Take 75 mg by mouth daily.       No current  facility-administered medications on file prior to visit.    Allergies  Allergen Reactions  . Morphine And Related     Passed out in hospital  . Xylocaine-Epinephrine (Lidocaine-Epinephrine)     Past Medical History  Diagnosis Date  . Hyperlipidemia   . Coronary artery disease 2010    Dr Gwen Pounds  . Anxiety   . Osteoarthritis of foot   . Parkinson's disease     Dr Kennyth Arnold at [facility] - 2000  . BPH (benign prostatic hypertrophy)   . Essential tremor     Past Surgical History  Procedure Laterality Date  . Coronary stent placement  3/10  . Cholecystectomy    . Tonsillectomy and adenoidectomy  childhood  . Lumbosacral fusion  1978  . Joint replacement  1999    Left knee TKR  . Carpal tunnel release      left  . Cataract extraction      right    Family History  Problem Relation Age of Onset  . Heart disease Mother   . Stroke Father   . Diabetes Father   . Cancer Brother   . Heart disease Brother   . Hypertension Brother   .  Diabetes Brother     History   Social History  . Marital Status: Married    Spouse Name: N/A    Number of Children: 5  . Years of Education: N/A   Occupational History  . Retired Insurance underwriter    Social History Main Topics  . Smoking status: Never Smoker   . Smokeless tobacco: Never Used  . Alcohol Use: Yes     Comment: very little  . Drug Use: No  . Sexually Active: Not on file   Other Topics Concern  . Not on file   Social History Narrative   Daughter in [place]. 4 other children ([place], [place], [place], [place] to [place])   Has living will   Wife is health care POA (then daughter Fleet Contras)   Would accept resuscitation attempts   Probably would not want tube feeds if cognitively unaware   Review of Systems Appetite is fine Trying to lose weight--down 4# Sleeps okay    Objective:   Physical Exam  Constitutional: He appears well-developed and well-nourished. No distress.  Neck: Normal  range of motion. Neck supple.  Cardiovascular: Normal rate and regular rhythm.  Exam reveals no gallop.   Murmur heard. Soft systolic murmur   Pulmonary/Chest: Effort normal and breath sounds normal. No respiratory distress. He has no wheezes. He has no rales.  Musculoskeletal: He exhibits no edema and no tenderness.  Lymphadenopathy:    He has no cervical adenopathy.  Neurological:  Mild resting tremor and bradykinesia  Psychiatric: He has a normal mood and affect. His behavior is normal.  Parkinson's affect          Assessment & Plan:

## 2012-10-13 NOTE — Assessment & Plan Note (Signed)
Now seeing Dr Arbutus Leas

## 2012-10-13 NOTE — Assessment & Plan Note (Signed)
Ongoing mood issues--mostly related to persistent dizziness Continue the venlafaxine for now

## 2012-10-16 ENCOUNTER — Encounter: Payer: Self-pay | Admitting: *Deleted

## 2012-11-10 ENCOUNTER — Telehealth: Payer: Self-pay | Admitting: Neurology

## 2012-11-10 NOTE — Telephone Encounter (Signed)
It is true that these are SE from levodopa but they are much more common with his mirapex, which is why I have talked with him about reducing that dose, which he has not wanted to do.  If anything, I would recommend we try and wean that some.

## 2012-11-10 NOTE — Telephone Encounter (Signed)
Called and spoke with the patient. Information given as per Dr. Arbutus Leas. The patient is currently taking the Mirapex 0.75 mg QID. I let him know that Dr. Arbutus Leas did not give me instructions on how to wean the Mirapex so I suggested that he drop back to the same dose but TID vs QID until he heard from [place] on Monday. He states that will be fine with him. Will reduce the Mirapex to TID through the weekend. We will let him know what to do next once Dr. Arbutus Leas gives her recommendation. **Dr. Arbutus Leas, please advise additional weaning instructions. Thanks. Jan

## 2012-11-10 NOTE — Telephone Encounter (Signed)
Pt states not much change with increasing the sinemet.  Over the past few weeks he has been having a lot more increased fatigue, dizziness, blurred vision.  He read an article about the mentioned drug and orthostatic hypotension and he felt like this may be good for him.

## 2012-11-12 NOTE — Telephone Encounter (Signed)
Yes, that was perfect.  We will do that for the next week and he can let us know how he did.  May need to reduce further.

## 2012-11-13 NOTE — Telephone Encounter (Signed)
Called and spoke with the patient. Instructions given as per Dr. Arbutus Leas. I had the patient repeat the change in the regime and also had him write down the new instructions. I asked that he call me on Friday with an update. He states he will.

## 2012-11-13 NOTE — Telephone Encounter (Signed)
Called and spoke with the patient. I asked how he was doing and he said not very good. He stopped his Mirapex completely instead of weaning to TID as we discussed. (was taking QID). I told him I would ask Dr. Arbutus Leas what to do at this point and that I would call him and let him know. He just said he misunderstood and was ok to wait for my return call. **Dr. Arbutus Leas, please advise. Thanks.

## 2012-11-13 NOTE — Telephone Encounter (Signed)
Oh no.  If hes only been off of it for the weekend 2 days, then he can just restart tid.

## 2012-11-17 ENCOUNTER — Telehealth: Payer: Self-pay | Admitting: Neurology

## 2012-11-17 NOTE — Telephone Encounter (Signed)
He may go to bid but no further until we sit in a follow up appt because I may need to adjust his other medication as well.

## 2012-11-17 NOTE — Telephone Encounter (Signed)
See phone note dated 11/10/12.

## 2012-11-17 NOTE — Telephone Encounter (Signed)
Returned a call from the patient. He reports that he is a little better now than he was earlier in the week. Is taking the Mirapex 3 tabs TID. He is wanting to try to reduce this further. I told him I would let Dr. Arbutus Leas know and see what she wanted to do next and that I would call him back. I stressed that he not change anything until he hears from [place]. He states he understands. **Dr. Arbutus Leas, please advise further reduction of Mirapex. Thanks.

## 2012-11-17 NOTE — Telephone Encounter (Signed)
Called and spoke with the patient. Information given as per Dr. Arbutus Leas below. I had the patient write instructions down. He will call to schedule a f/u appointment with Dr. Arbutus Leas on Monday.

## 2012-11-21 NOTE — Telephone Encounter (Signed)
Called to f/u re: appt. Spoke with Mrs. Renfrew. Scheduled a f/u appt for the patient with Dr. Arbutus Leas on 5/14 at 0900.

## 2012-11-29 ENCOUNTER — Ambulatory Visit (INDEPENDENT_AMBULATORY_CARE_PROVIDER_SITE_OTHER): Payer: Medicare Other | Admitting: Neurology

## 2012-11-29 ENCOUNTER — Encounter: Payer: Self-pay | Admitting: Neurology

## 2012-11-29 VITALS — BP 124/78 | HR 68 | Temp 97.9°F | Resp 16 | Wt 173.0 lb

## 2012-11-29 DIAGNOSIS — G20A1 Parkinson's disease without dyskinesia, without mention of fluctuations: Secondary | ICD-10-CM | POA: Insufficient documentation

## 2012-11-29 DIAGNOSIS — R269 Unspecified abnormalities of gait and mobility: Secondary | ICD-10-CM

## 2012-11-29 DIAGNOSIS — G20 Parkinson's disease: Secondary | ICD-10-CM | POA: Insufficient documentation

## 2012-11-29 DIAGNOSIS — G3184 Mild cognitive impairment, so stated: Secondary | ICD-10-CM

## 2012-11-29 MED ORDER — CARBIDOPA-LEVODOPA CR 25-100 MG PO TBCR: 2.0000 | EXTENDED_RELEASE_TABLET | Freq: Every day | ORAL | Status: DC

## 2012-11-29 NOTE — Progress Notes (Signed)
[name] Rich was seen today in the movement disorders clinic for neurologic consultation at the request of [name] Abide, MD.  The consultation is for the evaluation of Parkinsons disease.  The patient has previously seen [name]. Misty Rich.  I received and reviewed those records This patient is accompanied in the office by his spouse who supplements the history.  The patient reports that his first PD sx was when he was living in [place] perhaps in the year 2000.  He had L hand tremor.  He had a long hx of ET but this was a new resting tremor for him.  He was told he had PD.  He started on levodopa not long thereafter.  He did not think that the levodopa helped and he still is not sure that it helps.  However, he states that on one time he had stopped comtan and the tremor restarted.  He states that he restarted his comtan and within 20 min tremor was better.  Today, he tells me the same story but relates that the improvement was with the Mirapex and not the Comtan.    He takes levodopa 4 times per day.  He is on immediate release formulation.  Last visit, I did feel that the patient was underdosed.  After reviewing his [facility] records, they also felt that he was underdosed but the patient did not want to change his medication.  Interestingly, the patient recently called because of lightheadedness and wanted to go off of the levodopa.  However, I told him that while the levodopa can cause this, the Mirapex was likely a greater offender.  We told him that he decrease the Mirapex, but he ended up discontinuing it without a wean and felt terrible.  When we checked in with him a few days later, we told him to go back on the medication but at such a high dosage.  He was to go back to 0.75 mg twice a day and he is on that now.  Lightheadedness has improved greatly but he still had one episode where BP dropped to the mid 70s over 40s.  I noticed his primary care doctor address this and try to change his terazosin to tamsulosin.   The patient does not recall this and comes in today on terazosin.  He has no wearing off.    He has freezing in closets and when he turns, but he never really gets stuck.  He does admit to frustration that tremor was much better controlled years ago.    Wearing off:  no  How long before next dose:  n/a Falls:   no N/V:  no Hallucinations:  yes  (much less than on higher dose of mirapex)  visual distortions: yes Lightheaded:  no (initially stated that he did, as above, but later stated that it was more off balance now that the Mirapex has been decreased)  Syncope: no Dyskinesia:  no    PREVIOUS MEDICATIONS: Sinemet and Mirapex, comtan  ALLERGIES:   Allergies  Allergen Reactions  . Morphine And Related     Passed out in hospital  . Xylocaine-Epinephrine (Lidocaine-Epinephrine)     CURRENT MEDICATIONS:  Current Outpatient Prescriptions on File Prior to Visit  Medication Sig Dispense Refill  . alendronate (FOSAMAX) 70 MG tablet Take 70 mg by mouth every 7 (seven) days. Take with a full glass of water on an empty stomach.      Marland Kitchen aspirin 81 MG tablet Take 81 mg by mouth daily.      Marland Kitchen  carbidopa-levodopa (SINEMET CR) 25-100 MG per tablet Take 1.5 tablets by mouth 4 (four) times daily.      . cholecalciferol (VITAMIN D) 1000 UNITS tablet Take 1,000 Units by mouth daily. 50,000 weekly      . docusate sodium (COLACE) 100 MG capsule Take 100 mg by mouth 2 (two) times daily.      . entacapone (COMTAN) 200 MG tablet Take 200 mg by mouth 4 (four) times daily.      Marland Kitchen ibuprofen (ADVIL,MOTRIN) 200 MG tablet Take 200-400 mg by mouth 3 (three) times daily as needed.      . isosorbide dinitrate (ISORDIL) 30 MG tablet Take 30 mg by mouth once a week.      . lovastatin (MEVACOR) 40 MG tablet Take 20 mg by mouth at bedtime.      . nitroGLYCERIN (NITROSTAT) 0.4 MG SL tablet Place 0.4 mg under the tongue every 5 (five) minutes as needed.      . pramipexole (MIRAPEX) 0.25 MG tablet Take 0.75 mg by mouth 2  (two) times daily.       . ranolazine (RANEXA) 500 MG 12 hr tablet Take 500 mg by mouth 2 (two) times daily.      . tamsulosin (FLOMAX) 0.4 MG CAPS Take 1 capsule (0.4 mg total) by mouth daily.  30 capsule  11  . venlafaxine (EFFEXOR) 75 MG tablet Take 75 mg by mouth daily.       No current facility-administered medications on file prior to visit.    PAST MEDICAL HISTORY:   Past Medical History  Diagnosis Date  . Hyperlipidemia   . Coronary artery disease 2010    [name] Rich  . Anxiety   . Osteoarthritis of foot   . Parkinson's disease     [name] Rich at [facility] - 2000  . BPH (benign prostatic hypertrophy)   . Essential tremor     PAST SURGICAL HISTORY:   Past Surgical History  Procedure Laterality Date  . Coronary stent placement  3/10  . Cholecystectomy    . Tonsillectomy and adenoidectomy  childhood  . Lumbosacral fusion  1978  . Joint replacement  1999    Left knee TKR  . Carpal tunnel release      left  . Cataract extraction      right    SOCIAL HISTORY:   History   Social History  . Marital Status: Married    Spouse Name: N/A    Number of Children: 5  . Years of Education: N/A   Occupational History  . Retired Insurance underwriter    Social History Main Topics  . Smoking status: Never Smoker   . Smokeless tobacco: Never Used  . Alcohol Use: Yes     Comment: very little  . Drug Use: No  . Sexually Active: Not on file   Other Topics Concern  . Not on file   Social History Narrative   Daughter in [place]. 4 other children ([place], [place], [place], [place] to [place])   Has living will   Wife is health care POA (then daughter [name] Rich)   Would accept resuscitation attempts   Probably would not want tube feeds if cognitively unaware    FAMILY HISTORY:   Family Status  Relation Status Death Age  . Mother Deceased 2    CHF  . Father Deceased 6    MI, ET  . Brother Deceased     ?liver cancer  . Brother Alive  DM-2,  CAD  . Child Alive     5, alive and well    ROS:  A complete 10 system review of systems was obtained and was unremarkable apart from what is mentioned above.  PHYSICAL EXAMINATION:    VITALS:   Filed Vitals:   11/29/12 0845  BP: 124/78  Pulse: 68  Temp: 97.9 F (36.6 C)  Resp: 16  Weight: 173 lb (78.472 kg)    GEN:  The patient appears stated age and is in NAD. HEENT:  Normocephalic, atraumatic.  The mucous membranes are moist. The superficial temporal arteries are without ropiness or tenderness. CV:  RRR with 3/6 SEM Lungs:  CTAB Neck/HEME:  There are no carotid bruits bilaterally.  Neurological examination:  Orientation: The patient is alert and oriented x3. Fund of knowledge is appropriate.  Recent and remote memory are intact.  Attention and concentration are normal.    Able to name objects and repeat phrases.  He relies on his wife for clarification of some of the historical events. Cranial nerves: There is good facial symmetry. Pupils are equal round and reactive to light bilaterally. Fundoscopic exam reveals clear margins bilaterally. Extraocular muscles are intact. The visual fields are full to confrontational testing. The speech is fluent and clear. Soft palate rises symmetrically and there is no tongue deviation. Hearing is intact to conversational tone. Sensation: Sensation is intact to light and pinprick throughout (facial, trunk, extremities). Vibration is intact at the bilateral big toe. There is no consistent extinction with double simultaneous stimulation. There is no sensory dermatomal level identified. Motor: Strength is 5/5 in the bilateral upper and lower extremities.   Shoulder shrug is equal and symmetric.  There is no pronator drift. Deep tendon reflexes: Deep tendon reflexes are 2/4 at the bilateral biceps, triceps, brachioradialis, patella and 1/4 at the bilateral achilles. Plantar responses are downgoing bilaterally.  Movement examination: Tone: There is  increased tone in the RUE. Abnormal movements: UE resting tremor is present bilaterally, L>R and in the L foot. Coordination:  There is mild decremation with RAM's, seen more on the L than R.  Gait and Station: The patient has minimal difficulty arising out of a deep-seated chair without the use of the hands. The patient's stride length is markedly decreased with an increase in the tremor.  The patient has a negative pull test.      ASSESSMENT/PLAN:  1.  ET/PD  -I will slightly increase his carbidopa/levodopa 25/100 so that his schedule is as follows: 2 tablets/1.5 tablets/2 tablet/1.5 tablets.  -For now, I will keep him on the Mirapex 0.25 mg-3 tablets twice a day.  I think that we are going to ultimately have to wean this medication off because of orthostatic hypotension and because of cognitive impairment, we'll want to do this slowly.  -I would very much would like to see the patient do some physical therapy.  He was resistant agreeable and I sent the PT/OT prescription to [facility]. 2.  cognitive impairment, likely related to Parkinson's.  -We talked about the cognitive disorders associated with Parkinson's disease.  Talked about the fact that medication, especially Mirapex, can exacerbate this.  We talked about the fact that Exelon is indicated for this, but he did not want to try it.  -a MoCA will be done next visit. 3.  probable orthostatic hypotension.  -Again, this is likely related to Parkinson's.  We are working on reduction of the Mirapex.  --The patient was supposed to change his terazosin to tamsulosin  per his primary care physician's last note but did not do this.  I told him I would contact his primary care physician in this regard and get some advice 4.  I will plan on seeing him back in the next 4 weeks, sooner should new neurologic issues arise.  Time in the room with the patient today was 40 minutes.

## 2012-11-29 NOTE — Patient Instructions (Addendum)
1.  We will send referrals for therapy to the hospital  2.  Take carbidopa/levodopa 25/100 as follows:  2 tablets in 8am, 1.5 tablets at 12 pm, 2 tablets at 4 pm, 1.5 tablets at 8pm 3.  [facility].  Continue mirapex 5.  I will contact Dr. Alphonsus Sias about your terazosin 6.  Follow up 6 weeks

## 2012-12-13 ENCOUNTER — Encounter: Payer: Self-pay | Admitting: Internal Medicine

## 2012-12-13 ENCOUNTER — Ambulatory Visit (INDEPENDENT_AMBULATORY_CARE_PROVIDER_SITE_OTHER): Payer: Medicare Other | Admitting: Internal Medicine

## 2012-12-13 VITALS — BP 110/60 | HR 69 | Temp 98.6°F | Wt 173.0 lb

## 2012-12-13 DIAGNOSIS — I2510 Atherosclerotic heart disease of native coronary artery without angina pectoris: Secondary | ICD-10-CM

## 2012-12-13 DIAGNOSIS — M810 Age-related osteoporosis without current pathological fracture: Secondary | ICD-10-CM

## 2012-12-13 DIAGNOSIS — F0280 Dementia in other diseases classified elsewhere without behavioral disturbance: Secondary | ICD-10-CM

## 2012-12-13 DIAGNOSIS — N400 Enlarged prostate without lower urinary tract symptoms: Secondary | ICD-10-CM

## 2012-12-13 DIAGNOSIS — R42 Dizziness and giddiness: Secondary | ICD-10-CM

## 2012-12-13 DIAGNOSIS — G20 Parkinson's disease: Secondary | ICD-10-CM

## 2012-12-13 MED ORDER — RIVASTIGMINE 4.6 MG/24HR TD PT24: 1.0000 | MEDICATED_PATCH | Freq: Every day | TRANSDERMAL | Status: DC

## 2012-12-13 NOTE — Assessment & Plan Note (Signed)
T scores under -2 in hip -3.7 in forearm only Discussed options --will just continue the vitamin D, not the alendronate

## 2012-12-13 NOTE — Assessment & Plan Note (Signed)
Persists but he hasn't stopped the terazosin Off isosorbide mirapex decreased Discussed cane--he wishes to hold off

## 2012-12-13 NOTE — Patient Instructions (Addendum)
Please make sure you stop the terazosin. Continue the tamsulosin Stop the isosorbide also---but keep fresh nitroglycerin in case you get angina Stop isosorbide Start the patch for memory---call if any problems tolerating this

## 2012-12-13 NOTE — Assessment & Plan Note (Signed)
Has declined since the past visit He is now willing to try exelon

## 2012-12-13 NOTE — Assessment & Plan Note (Signed)
Voiding okay Will have him stop the terazosin

## 2012-12-13 NOTE — Progress Notes (Signed)
Subjective:    Patient ID: [name] Rich, male    DOB: 1929/03/10, 79 y.o.   MRN: [id number]  HPI Here with wife Still has some dizziness Didn't stop the terazosin though he started the tamsulosin  Has cut back on the isosorbide to once a week No chest pain--or just "lightly" a couple of weeks ago Breathing seems better--[place]  Has cut back on mirapex based on Dr Don Perking recommendations He feels his cognitive abilities are some better--more stamina with prolonged bridge game Wife notes more confusion though in every day life  Voids okay Some increased frequency but not severe change Nocturia x 1-2 only  Current Outpatient Prescriptions on File Prior to Visit  Medication Sig Dispense Refill  . alendronate (FOSAMAX) 70 MG tablet Take 70 mg by mouth every 7 (seven) days. Take with a full glass of water on an empty stomach.      Marland Kitchen aspirin 81 MG tablet Take 81 mg by mouth daily.      . cholecalciferol (VITAMIN D) 1000 UNITS tablet Take 1,000 Units by mouth daily. 50,000 weekly      . docusate sodium (COLACE) 100 MG capsule Take 100 mg by mouth 2 (two) times daily.      . entacapone (COMTAN) 200 MG tablet Take 200 mg by mouth 4 (four) times daily.      Marland Kitchen ibuprofen (ADVIL,MOTRIN) 200 MG tablet Take 200-400 mg by mouth 3 (three) times daily as needed.      . lovastatin (MEVACOR) 40 MG tablet Take 20 mg by mouth at bedtime.      . nitroGLYCERIN (NITROSTAT) 0.4 MG SL tablet Place 0.4 mg under the tongue every 5 (five) minutes as needed.      . pramipexole (MIRAPEX) 0.25 MG tablet Take 0.75 mg by mouth 2 (two) times daily.       . ranolazine (RANEXA) 500 MG 12 hr tablet Take 500 mg by mouth 2 (two) times daily.      . tamsulosin (FLOMAX) 0.4 MG CAPS Take 1 capsule (0.4 mg total) by mouth daily.  30 capsule  11  . venlafaxine (EFFEXOR) 75 MG tablet Take 75 mg by mouth daily.       No current facility-administered medications on file prior to  visit.    Allergies  Allergen Reactions  . Morphine And Related     Passed out in hospital  . Xylocaine-Epinephrine (Lidocaine-Epinephrine)     Past Medical History  Diagnosis Date  . Hyperlipidemia   . Coronary artery disease 2010    Dr Gwen Pounds  . Anxiety   . Osteoarthritis of foot   . Parkinson's disease     Dr Kennyth Arnold at [facility] - 2000  . BPH (benign prostatic hypertrophy)   . Essential tremor     Past Surgical History  Procedure Laterality Date  . Coronary stent placement  3/10  . Cholecystectomy    . Tonsillectomy and adenoidectomy  childhood  . Lumbosacral fusion  1978  . Joint replacement  1999    Left knee TKR  . Carpal tunnel release      left  . Cataract extraction      right    Family History  Problem Relation Age of Onset  . Heart disease Mother   . Stroke Father   . Diabetes Father   . Cancer Brother   . Heart disease Brother   . Hypertension Brother   . Diabetes Brother  History   Social History  . Marital Status: Married    Spouse Name: N/A    Number of Children: 5  . Years of Education: N/A   Occupational History  . Retired Insurance underwriter    Social History Main Topics  . Smoking status: Never Smoker   . Smokeless tobacco: Never Used  . Alcohol Use: Yes     Comment: very little  . Drug Use: No  . Sexually Active: Not on file   Other Topics Concern  . Not on file   Social History Narrative   Daughter in [place]. 4 other children ([place], [place], [place], [place] to [place])   Has living will   Wife is health care POA (then daughter Fleet Contras)   Would accept resuscitation attempts   Probably would not want tube feeds if cognitively unaware   Review of Systems Sleeps okay Appetite is good---weight stable     Objective:   Physical Exam  Constitutional: He appears well-developed. No distress.  Neck: Normal range of motion. No thyromegaly present.  Cardiovascular: Normal rate and regular  rhythm.  Exam reveals no gallop.   Murmur heard. Soft systolic murmur in aortic area  Musculoskeletal: He exhibits edema. He exhibits no tenderness.  Lower calves are thick but not pitting  Lymphadenopathy:    He has no cervical adenopathy.  Neurological:  Resting tremor Mild bradykinesia Slow gait but not overtly shuffling  Psychiatric: He has a normal mood and affect. His behavior is normal.          Assessment & Plan:

## 2012-12-13 NOTE — Assessment & Plan Note (Signed)
Less angina now taking isosorbide once a week Will stop completely

## 2012-12-13 NOTE — Assessment & Plan Note (Signed)
Dr Tat managing

## 2012-12-14 ENCOUNTER — Other Ambulatory Visit: Payer: Self-pay

## 2012-12-14 NOTE — Telephone Encounter (Signed)
Pt  And [facility] left v/m requesting refill for 30 days for lovastatin and Ranexa. Pt usually gets med from [place] but has not received meds yet.Please advise.

## 2012-12-15 MED ORDER — LOVASTATIN 40 MG PO TABS: 20.0000 mg | ORAL_TABLET | Freq: Every day | ORAL | Status: AC

## 2012-12-15 MED ORDER — RANOLAZINE ER 500 MG PO TB12: 500.0000 mg | ORAL_TABLET | Freq: Two times a day (BID) | ORAL | Status: DC

## 2012-12-15 NOTE — Telephone Encounter (Signed)
rx sent to pharmacy by e-script  

## 2012-12-15 NOTE — Telephone Encounter (Signed)
Pt request call back when refill done.

## 2012-12-15 NOTE — Telephone Encounter (Signed)
Okay to refill both for a year 

## 2012-12-15 NOTE — Telephone Encounter (Signed)
Pt came by office to ck status of refill. Advised pt refill sent to [facility].

## 2012-12-15 NOTE — Telephone Encounter (Signed)
i'm not sure what Ranexa is, please advise

## 2012-12-20 ENCOUNTER — Encounter: Payer: Self-pay | Admitting: Neurology

## 2013-01-01 ENCOUNTER — Encounter: Payer: Self-pay | Admitting: Neurology

## 2013-01-01 ENCOUNTER — Ambulatory Visit (INDEPENDENT_AMBULATORY_CARE_PROVIDER_SITE_OTHER): Payer: Medicare Other | Admitting: Neurology

## 2013-01-01 VITALS — BP 108/64 | HR 80 | Temp 98.2°F | Resp 18 | Wt 173.0 lb

## 2013-01-01 DIAGNOSIS — F0280 Dementia in other diseases classified elsewhere without behavioral disturbance: Secondary | ICD-10-CM

## 2013-01-01 DIAGNOSIS — I951 Orthostatic hypotension: Secondary | ICD-10-CM

## 2013-01-01 DIAGNOSIS — G20 Parkinson's disease: Secondary | ICD-10-CM

## 2013-01-01 MED ORDER — RIVASTIGMINE 9.5 MG/24HR TD PT24: 1.0000 | MEDICATED_PATCH | Freq: Every day | TRANSDERMAL | Status: DC

## 2013-01-01 MED ORDER — RIVASTIGMINE 4.6 MG/24HR TD PT24: 1.0000 | MEDICATED_PATCH | Freq: Every day | TRANSDERMAL | Status: DC

## 2013-01-01 MED ORDER — CARBIDOPA-LEVODOPA ER 50-200 MG PO TBCR: 1.0000 | EXTENDED_RELEASE_TABLET | Freq: Every day | ORAL | Status: DC

## 2013-01-01 NOTE — Progress Notes (Signed)
[name] Rich was seen today in the movement disorders clinic for neurologic consultation at the request of [name] Abide, MD.  The consultation is for the evaluation of Parkinsons disease.  The patient has previously seen Dr. Misty Rich.  I received and reviewed those records This patient is accompanied in the office by his spouse who supplements the history.  The patient reports that his first PD sx was when he was living in [place] perhaps in the year 2000.  He had L hand tremor.  He had a long hx of ET but this was a new resting tremor for him.  He was told he had PD.  He started on levodopa not long thereafter.  He did not think that the levodopa helped and he still is not sure that it helps.  However, he states that on one time he had stopped comtan and the tremor restarted.  He states that he restarted his comtan and within 20 min tremor was better.  Today, he tells me the same story but relates that the improvement was with the Mirapex and not the Comtan.    He takes levodopa 4 times per day.  He is on immediate release formulation.  Last visit, I did feel that the patient was underdosed.  After reviewing his [facility] records, they also felt that he was underdosed but the patient did not want to change his medication.  Interestingly, the patient recently called because of lightheadedness and wanted to go off of the levodopa.  However, I told him that while the levodopa can cause this, the Mirapex was likely a greater offender.  We told him that he decrease the Mirapex, but he ended up discontinuing it without a wean and felt terrible.  When we checked in with him a few days later, we told him to go back on the medication but at such a high dosage.  He was to go back to 0.75 mg twice a day and he is on that now.  Lightheadedness has improved greatly but he still had one episode where BP dropped to the mid 70s over 40s.  I noticed his primary care doctor address this and try to change his terazosin to tamsulosin.   The patient does not recall this and comes in today on terazosin.  He has no wearing off.    He has freezing in closets and when he turns, but he never really gets stuck.  He does admit to frustration that tremor was much better controlled years ago.    Update 01/01/13:  Since last visit, he did see his primary care physician.  I reviewed the notes.  He was placed on Exelon patch but he did not take it because it was too costly.  His primary care physician did discontinue his terazosin.  We have been working on weaning his Mirapex because orthostatic hypotension and cognitive changes.  He is currently on Mirapex 0.25 mg, 3 tablets twice a day.  His dizziness has been much better since the terazosin was discontinued.  He is also on carbidopa/levodopa 25/100 on the following schedule: 2/1.5/2/1.5.  He is going to PT in [place].  He has only been one time for an evaluation.  He is scheduled to go two days per week, for PT and OT.    He notes that his L hand has been been falling asleep, and involves all fingers but not the thumb.     PREVIOUS MEDICATIONS: Sinemet and Mirapex, comtan  ALLERGIES:   Allergies  Allergen  Reactions  . Morphine And Related     Passed out in hospital  . Xylocaine-Epinephrine (Lidocaine-Epinephrine)     CURRENT MEDICATIONS:  Current Outpatient Prescriptions on File Prior to Visit  Medication Sig Dispense Refill  . aspirin 81 MG tablet Take 81 mg by mouth daily.      . carbidopa-levodopa (SINEMET CR) 25-100 MG per tablet Take 7 tablets by mouth as directed.      . cholecalciferol (VITAMIN D) 1000 UNITS tablet Take 1,000 Units by mouth daily.       Marland Kitchen docusate sodium (COLACE) 100 MG capsule Take 100 mg by mouth 2 (two) times daily.      . entacapone (COMTAN) 200 MG tablet Take 200 mg by mouth 4 (four) times daily.      Marland Kitchen ibuprofen (ADVIL,MOTRIN) 200 MG tablet Take 200-400 mg by mouth 3 (three) times daily as needed.      . lovastatin (MEVACOR) 40 MG tablet Take 0.5  tablets (20 mg total) by mouth at bedtime.  15 tablet  0  . nitroGLYCERIN (NITROSTAT) 0.4 MG SL tablet Place 0.4 mg under the tongue every 5 (five) minutes as needed.      . pramipexole (MIRAPEX) 0.25 MG tablet Take 0.75 mg by mouth 2 (two) times daily.       . ranolazine (RANEXA) 500 MG 12 hr tablet Take 1 tablet (500 mg total) by mouth 2 (two) times daily.  60 tablet  0  . tamsulosin (FLOMAX) 0.4 MG CAPS Take 1 capsule (0.4 mg total) by mouth daily.  30 capsule  11  . venlafaxine (EFFEXOR) 75 MG tablet Take 75 mg by mouth daily.       No current facility-administered medications on file prior to visit.    PAST MEDICAL HISTORY:   Past Medical History  Diagnosis Date  . Hyperlipidemia   . Coronary artery disease 2010    Dr Gwen Pounds  . Anxiety   . Osteoarthritis of foot   . Parkinson's disease     Dr Kennyth Arnold at [facility] - 2000  . BPH (benign prostatic hypertrophy)   . Essential tremor   . Osteoporosis 2012    forearm only    PAST SURGICAL HISTORY:   Past Surgical History  Procedure Laterality Date  . Coronary stent placement  3/10  . Cholecystectomy    . Tonsillectomy and adenoidectomy  childhood  . Lumbosacral fusion  1978  . Joint replacement  1999    Left knee TKR  . Carpal tunnel release      left  . Cataract extraction      right    SOCIAL HISTORY:   History   Social History  . Marital Status: Married    Spouse Name: N/A    Number of Children: 5  . Years of Education: N/A   Occupational History  . Retired Insurance underwriter    Social History Main Topics  . Smoking status: Never Smoker   . Smokeless tobacco: Never Used  . Alcohol Use: Yes     Comment: very little  . Drug Use: No  . Sexually Active: Not on file   Other Topics Concern  . Not on file   Social History Narrative   Daughter in [place]. 4 other children ([place], [place], [place], [place] to [place])   Has living will   Wife is health care POA (then daughter  Fleet Contras)   Would accept resuscitation attempts   Probably would not want tube feeds if cognitively unaware  FAMILY HISTORY:   Family Status  Relation Status Death Age  . Mother Deceased 17    CHF  . Father Deceased 57    MI, ET  . Brother Deceased     ?liver cancer  . Brother Alive     DM-2, CAD  . Child Alive     5, alive and well    ROS:  A complete 10 system review of systems was obtained and was unremarkable apart from what is mentioned above.  PHYSICAL EXAMINATION:    VITALS:   Filed Vitals:   01/01/13 1434 01/01/13 1552 01/01/13 1553 01/01/13 1554  BP: 106/70 102/68 106/68 108/64  Pulse: 76 72 76 80  Temp: 98.2 F (36.8 C)     Resp: 18     Weight: 173 lb (78.472 kg)       GEN:  The patient appears stated age and is in NAD. HEENT:  Normocephalic, atraumatic.  The mucous membranes are moist. The superficial temporal arteries are without ropiness or tenderness. CV:  RRR with 3/6 SEM Lungs:  CTAB Neck/HEME:  There are no carotid bruits bilaterally.  Neurological examination:  Orientation: A MoCA was done today, 01/01/13, and the patient scored a 21/30. Cranial nerves: There is good facial symmetry. Pupils are equal round and reactive to light bilaterally. Fundoscopic exam reveals clear margins bilaterally. Extraocular muscles are intact. The visual fields are full to confrontational testing. The speech is fluent and clear. Soft palate rises symmetrically and there is no tongue deviation. Hearing is intact to conversational tone. Sensation: Sensation is intact to light and pinprick throughout (facial, trunk, extremities). Vibration is intact at the bilateral big toe. There is no consistent extinction with double simultaneous stimulation. There is no sensory dermatomal level identified. Motor: Strength is 5/5 in the bilateral upper and lower extremities.   Shoulder shrug is equal and symmetric.  There is no pronator drift. Deep tendon reflexes: Deep tendon reflexes are  2/4 at the bilateral biceps, triceps, brachioradialis, patella and 1/4 at the bilateral achilles. Plantar responses are downgoing bilaterally.  Movement examination: Tone: There is increased tone in the bilateral upper extremities, mild. Abnormal movements: UE resting tremor is present bilaterally, L>R and in the L foot. Coordination:  There is mild decremation with RAM's, seen more on the L than R.  Gait and Station: The patient has minimal difficulty arising out of a deep-seated chair without the use of the hands.  The patient has a "marching" type of gait, with short steps.    ASSESSMENT/PLAN:  1.  ET/PD  -I will change the carbidopa/levodopa so that he is continuing to take one tablet in the morning, 1-1/2 tablets at lunch and 2 tablets before dinner and then will add carbidopa/levodopa 50/200 at night.  He is currently having difficulty keeping up with all the daytime dosages, and ends up with one at night anyway.  -For now, I will keep him on the Mirapex 0.25 mg-3 tablets twice a day.  He was not orthostatic in the office today and seems to be doing much better now that terazosin has been discontinued.  We will likely need to continue weaning this in the future because of cognitive changes, but he seems to be a good balance right now.  -He will continue his physical therapy at [facility].  I encouraged him to get some regular cardiovascular exercise, but in a safe manner. 2.  Cognitive changes.  -We talked about the cognitive disorders associated with Parkinson's disease.  Talked about  the fact that medication can exacerbate this, as above.  -He tried to fill the Exelon patch given by his primary care physician but it was quite extensive.  He asked me to write a prescription and a letter to the [place] which I did.  I also gave him samples of 4.6 mg patch for one month and then a prescription for the 9.5 mg patch. 3.  probable orthostatic hypotension.  -Again, this is likely related to  Parkinson's.  The discontinuation of terazosin and decrease of Mirapex seems to have helped.  He was not orthostatic in the office today. 4.  I will plan on seeing him back in the next 3-4 months, sooner should new neurologic issues arise.  Time in the room with the patient today was 40 minutes.

## 2013-01-01 NOTE — Patient Instructions (Signed)
1.  Continue to take the mirapex on your schedule, 0.25 mg, 3 tablets twice per day 2.  Take the levodopa as follows:  2 in AM, 1.5 tabs before lunch, 2 before dinner 3.  Take the NEW carbidopa/levodopa Cr 50/200 at bedtime 4.  Your RX and letter for the [place] is provided 5.  See you back in 3-4 months

## 2013-01-15 ENCOUNTER — Telehealth: Payer: Self-pay

## 2013-01-15 NOTE — Telephone Encounter (Signed)
Okay to d/c

## 2013-01-15 NOTE — Telephone Encounter (Signed)
Pt.notified

## 2013-01-15 NOTE — Telephone Encounter (Signed)
Pt states since starting exelon patch everything has gotten worse, his tremor, breathing, walking is all worse.  He would like to stop taking.

## 2013-01-16 ENCOUNTER — Encounter: Payer: Self-pay | Admitting: Neurology

## 2013-01-25 ENCOUNTER — Other Ambulatory Visit: Payer: Self-pay | Admitting: Internal Medicine

## 2013-01-25 NOTE — Telephone Encounter (Signed)
Okay to refill for a year 

## 2013-01-25 NOTE — Telephone Encounter (Signed)
rx sent to pharmacy by e-script  

## 2013-02-16 ENCOUNTER — Encounter: Payer: Self-pay | Admitting: Neurology

## 2013-03-02 ENCOUNTER — Telehealth: Payer: Self-pay | Admitting: *Deleted

## 2013-03-02 NOTE — Telephone Encounter (Signed)
Form gave to [place] and pt picked up

## 2013-03-02 NOTE — Telephone Encounter (Signed)
Pt dropped off form to be filled out for a study being done at [facility] for parkinson's and it starts tomorrow, form needed today Form on your desk

## 2013-03-02 NOTE — Telephone Encounter (Signed)
Form signed No charge 

## 2013-03-16 ENCOUNTER — Encounter: Payer: Self-pay | Admitting: Internal Medicine

## 2013-03-16 ENCOUNTER — Ambulatory Visit (INDEPENDENT_AMBULATORY_CARE_PROVIDER_SITE_OTHER): Payer: Medicare Other | Admitting: Internal Medicine

## 2013-03-16 VITALS — BP 110/60 | HR 80 | Temp 97.9°F | Wt 174.0 lb

## 2013-03-16 DIAGNOSIS — G20 Parkinson's disease: Secondary | ICD-10-CM

## 2013-03-16 DIAGNOSIS — N400 Enlarged prostate without lower urinary tract symptoms: Secondary | ICD-10-CM

## 2013-03-16 DIAGNOSIS — F0280 Dementia in other diseases classified elsewhere without behavioral disturbance: Secondary | ICD-10-CM

## 2013-03-16 DIAGNOSIS — I2510 Atherosclerotic heart disease of native coronary artery without angina pectoris: Secondary | ICD-10-CM

## 2013-03-16 DIAGNOSIS — F411 Generalized anxiety disorder: Secondary | ICD-10-CM

## 2013-03-16 DIAGNOSIS — R42 Dizziness and giddiness: Secondary | ICD-10-CM

## 2013-03-16 DIAGNOSIS — F419 Anxiety disorder, unspecified: Secondary | ICD-10-CM

## 2013-03-16 NOTE — Progress Notes (Signed)
Subjective:    Patient ID: [name] Rich, male    DOB: 05-30-1929, 79 y.o.   MRN: [id number]  HPI Here with wife Notes that the Parkinson's is slowly getting worse Is on dance study from the [facility] dept Has weaned down on mirapex---but different dosing on [place] form and he isn't sure of dose now Finished 6 weeks of PT at [facility] to help  Still some dizziness Does get faint feeling which are brief---usually upon standing No syncope Is off the terazosin  Tried the exelon patch Had "bad reaction"---- felt "real bad" Feels his dance class,etc has helped him cognitively Wife feels his thinking process is slower---hard to think and move at same time  No chest pain --- or very rare Very brief---"a great improvement" Walks without pain---just gets tired or dyspnea  Voids okay Some urge incontinence On the tamsulosin  Current Outpatient Prescriptions on File Prior to Visit  Medication Sig Dispense Refill  . aspirin 81 MG tablet Take 81 mg by mouth daily.      . carbidopa-levodopa (SINEMET CR) 25-100 MG per tablet Take 7 tablets by mouth as directed.      . carbidopa-levodopa (SINEMET CR) 50-200 MG per tablet Take 1 tablet by mouth at bedtime.  90 tablet  3  . cholecalciferol (VITAMIN D) 1000 UNITS tablet Take 1,000 Units by mouth daily.       Marland Kitchen docusate sodium (COLACE) 100 MG capsule Take 100 mg by mouth 2 (two) times daily.      . entacapone (COMTAN) 200 MG tablet Take 200 mg by mouth 4 (four) times daily.      Marland Kitchen ibuprofen (ADVIL,MOTRIN) 200 MG tablet Take 200-400 mg by mouth 3 (three) times daily as needed.      . lovastatin (MEVACOR) 40 MG tablet Take 0.5 tablets (20 mg total) by mouth at bedtime.  15 tablet  0  . pramipexole (MIRAPEX) 0.25 MG tablet Take 0.75 mg by mouth 2 (two) times daily.       Marland Kitchen RANEXA 500 MG 12 hr tablet TAKE ONE TABLET TWICE A DAY  60 tablet  11  . tamsulosin (FLOMAX) 0.4 MG CAPS Take 1 capsule (0.4 mg total) by mouth daily.  30 capsule  11  . venlafaxine  (EFFEXOR) 75 MG tablet Take 75 mg by mouth daily.       No current facility-administered medications on file prior to visit.    Allergies  Allergen Reactions  . Morphine And Related     Passed out in hospital  . Xylocaine-Epinephrine [Lidocaine-Epinephrine]   . Exelon [Rivastigmine]     Felt "real bad"    Past Medical History  Diagnosis Date  . Hyperlipidemia   . Coronary artery disease 2010    Dr Gwen Pounds  . Anxiety   . Osteoarthritis of foot   . Parkinson's disease     Dr Kennyth Arnold at [facility] - 2000  . BPH (benign prostatic hypertrophy)   . Essential tremor   . Osteoporosis 2012    forearm only    Past Surgical History  Procedure Laterality Date  . Coronary stent placement  3/10  . Cholecystectomy    . Tonsillectomy and adenoidectomy  childhood  . Lumbosacral fusion  1978  . Joint replacement  1999    Left knee TKR  . Carpal tunnel release      left  . Cataract extraction      right    Family History  Problem Relation Age of Onset  . Heart  disease Mother   . Stroke Father   . Diabetes Father   . Cancer Brother   . Heart disease Brother   . Hypertension Brother   . Diabetes Brother     History   Social History  . Marital Status: Married    Spouse Name: N/A    Number of Children: 5  . Years of Education: N/A   Occupational History  . Retired Insurance underwriter    Social History Main Topics  . Smoking status: Never Smoker   . Smokeless tobacco: Never Used  . Alcohol Use: Yes     Comment: very little  . Drug Use: No  . Sexual Activity: Not on file   Other Topics Concern  . Not on file   Social History Narrative   Daughter in [place]. 4 other children ([place], [place], [place], [place] to [place])   Has living will   Wife is health care POA (then daughter Fleet Contras)   Would accept resuscitation attempts   Probably would not want tube feeds if cognitively unaware   Review of Systems Appetite is good Weight is  stable Sleeps well     Objective:   Physical Exam  Constitutional: He appears well-developed and well-nourished. No distress.  Neck: Normal range of motion. Neck supple. No thyromegaly present.  Cardiovascular: Normal rate, regular rhythm and normal heart sounds.  Exam reveals no gallop.   No murmur heard. Pulmonary/Chest: Effort normal and breath sounds normal. No respiratory distress. He has no wheezes. He has no rales.  Lymphadenopathy:    He has no cervical adenopathy.  Neurological:  Coarse tremor in hands Gait is slightly ataxic but not shuffling --reasonably stable  Psychiatric: He has a normal mood and affect. His behavior is normal.          Assessment & Plan:

## 2013-03-16 NOTE — Patient Instructions (Signed)
You can try taking the venlafaxine every other day for 2 weeks. If you have no problems, you can try without it. If your nerves act up again, restart it.

## 2013-03-16 NOTE — Assessment & Plan Note (Signed)
He feels this isn't bad Will try to wean off the venlafaxine

## 2013-03-16 NOTE — Assessment & Plan Note (Signed)
Voiding okay on the tamsulosin

## 2013-03-16 NOTE — Assessment & Plan Note (Signed)
Angina virtually gone even off the isosorbide

## 2013-03-16 NOTE — Assessment & Plan Note (Signed)
Still seems to have trouble understanding med changes Slow progression

## 2013-03-16 NOTE — Assessment & Plan Note (Signed)
Slow progression per wife Intolerant of the exelon patch

## 2013-03-16 NOTE — Assessment & Plan Note (Signed)
Still fairly frequent but ?not as bad Probably has element of postural hypotension as part of his Parkinson's

## 2013-04-03 ENCOUNTER — Ambulatory Visit: Payer: Medicare Other | Admitting: Neurology

## 2013-04-04 ENCOUNTER — Ambulatory Visit (INDEPENDENT_AMBULATORY_CARE_PROVIDER_SITE_OTHER): Payer: Medicare Other | Admitting: Neurology

## 2013-04-04 ENCOUNTER — Encounter: Payer: Self-pay | Admitting: Neurology

## 2013-04-04 VITALS — BP 102/60 | HR 60 | Temp 97.5°F | Resp 20 | Wt 172.4 lb

## 2013-04-04 DIAGNOSIS — G20 Parkinson's disease: Secondary | ICD-10-CM

## 2013-04-04 DIAGNOSIS — G3184 Mild cognitive impairment, so stated: Secondary | ICD-10-CM

## 2013-04-04 DIAGNOSIS — G250 Essential tremor: Secondary | ICD-10-CM

## 2013-04-04 NOTE — Patient Instructions (Addendum)
1.  Hold your carbidopa/levodopa and your entacapone the DAY before you come to see me next visit (take the morning dosages the day before but don't take any after noon) 2.  Exercise 3.  Please make a follow up appointment for mid-January on your way out today. Thank you!

## 2013-04-04 NOTE — Progress Notes (Signed)
[name] Rich was seen today in the movement disorders clinic for neurologic consultation at the request of [name], MD.  The consultation is for the evaluation of Parkinsons disease.  The patient has previously seen Dr. [name].  I received and reviewed those records This patient is accompanied in the office by his spouse who supplements the history.  The patient reports that his first PD sx was when he was living in [place] perhaps in the year 2000.  He had L hand tremor.  He had a long hx of ET but this was a new resting tremor for him.  He was told he had PD.  He started on levodopa not long thereafter.  He did not think that the levodopa helped and he still is not sure that it helps.  However, he states that on one time he had stopped comtan and the tremor restarted.  He states that he restarted his comtan and within 20 min tremor was better.  Today, he tells me the same story but relates that the improvement was with the Mirapex and not the Comtan.    He takes levodopa 4 times per day.  He is on immediate release formulation.  Last visit, I did feel that the patient was underdosed.  After reviewing his [facility] records, they also felt that he was underdosed but the patient did not want to change his medication.  Interestingly, the patient recently called because of lightheadedness and wanted to go off of the levodopa.  However, I told him that while the levodopa can cause this, the Mirapex was likely a greater offender.  We told him that he decrease the Mirapex, but he ended up discontinuing it without a wean and felt terrible.  When we checked in with him a few days later, we told him to go back on the medication but at such a high dosage.  He was to go back to 0.75 mg twice a day and he is on that now.  Lightheadedness has improved greatly but he still had one episode where BP dropped to the mid 70s over 40s.  I noticed his primary care doctor address this and try to change his terazosin to tamsulosin.   The patient does not recall this and comes in today on terazosin.  He has no wearing off.    He has freezing in closets and when he turns, but he never really gets stuck.  He does admit to frustration that tremor was much better controlled years ago.    Update 01/01/13:  Since last visit, he did see his primary care physician.  I reviewed the notes.  He was placed on Exelon patch but he did not take it because it was too costly.  His primary care physician did discontinue his terazosin.  We have been working on weaning his Mirapex because orthostatic hypotension and cognitive changes.  He is currently on Mirapex 0.25 mg, 3 tablets twice a day.  His dizziness has been much better since the terazosin was discontinued.  He is also on carbidopa/levodopa 25/100 on the following schedule: 2/1.5/2/1.5.  He is going to PT in [place].  He has only been one time for an evaluation.  He is scheduled to go two days per week, for PT and OT.    He notes that his L hand has been been falling asleep, and involves all fingers but not the thumb.    04/04/13 update:    The patient is following up today regarding his ET/PD.  He is currently on carbidopa/levodopa  25/100 on the following schedule: 2/1.5/2/1.5.  Last visit, we added carbidopa/levodopa 50/200 CR at bedtime.  He remains on pramipexole 0.25 mg, 3 tablets twice per day.  He has been to physical therapy and thinks that that helps.  He is involved with the dance for PD study through [facility] and is enjoying it and thinks it helps.  I added the Exelon patch last visit because of cognitive changes, but he felt that it made his Parkinson's symptoms worse.  He stopped it.  He is not exercising.  Cognitively, he is doing well.  He is still able to play bridge with his friends, although the tremor does interfere with holding the cards.  He did fall yesterday when he was going up a curb, but his wife caught him before he hit the ground.  He has had no other falls.  He has had no  hallucinations.  He did have one very vivid dreaming he screamed out.   PREVIOUS MEDICATIONS: Sinemet and Mirapex, comtan, Exelon patch (felt that he made Parkinson's symptoms worse)  ALLERGIES:   Allergies  Allergen Reactions  . Morphine And Related     Passed out in hospital  . Xylocaine-Epinephrine [Lidocaine-Epinephrine]   . Exelon [Rivastigmine]     Felt "real bad"    CURRENT MEDICATIONS:  Current Outpatient Prescriptions on File Prior to Visit  Medication Sig Dispense Refill  . aspirin 81 MG tablet Take 81 mg by mouth daily.      . carbidopa-levodopa (SINEMET CR) 25-100 MG per tablet Take 7 tablets by mouth as directed.      . carbidopa-levodopa (SINEMET CR) 50-200 MG per tablet Take 1 tablet by mouth at bedtime.  90 tablet  3  . cholecalciferol (VITAMIN D) 1000 UNITS tablet Take 1,000 Units by mouth daily.       Marland Kitchen docusate sodium (COLACE) 100 MG capsule Take 100 mg by mouth 2 (two) times daily.      . entacapone (COMTAN) 200 MG tablet Take 200 mg by mouth 4 (four) times daily.      Marland Kitchen ibuprofen (ADVIL,MOTRIN) 200 MG tablet Take 200-400 mg by mouth 3 (three) times daily as needed.      . lovastatin (MEVACOR) 40 MG tablet Take 0.5 tablets (20 mg total) by mouth at bedtime.  15 tablet  0  . pramipexole (MIRAPEX) 0.25 MG tablet Take 0.75 mg by mouth 2 (two) times daily.       Marland Kitchen RANEXA 500 MG 12 hr tablet TAKE ONE TABLET TWICE A DAY  60 tablet  11  . tamsulosin (FLOMAX) 0.4 MG CAPS Take 1 capsule (0.4 mg total) by mouth daily.  30 capsule  11  . venlafaxine (EFFEXOR) 75 MG tablet Take 75 mg by mouth daily.       No current facility-administered medications on file prior to visit.    PAST MEDICAL HISTORY:   Past Medical History  Diagnosis Date  . Hyperlipidemia   . Coronary artery disease 2010    Dr Gwen Pounds  . Anxiety   . Osteoarthritis of foot   . Parkinson's disease     Dr Kennyth Arnold at [facility] - 2000  . BPH (benign prostatic hypertrophy)   . Essential tremor   . Osteoporosis  2012    forearm only    PAST SURGICAL HISTORY:   Past Surgical History  Procedure Laterality Date  . Coronary stent placement  3/10  . Cholecystectomy    . Tonsillectomy and adenoidectomy  childhood  . Lumbosacral fusion  1978  . Joint replacement  1999    Left knee TKR  . Carpal tunnel release      left  . Cataract extraction      right    SOCIAL HISTORY:   History   Social History  . Marital Status: Married    Spouse Name: N/A    Number of Children: 5  . Years of Education: N/A   Occupational History  . Retired Insurance underwriter    Social History Main Topics  . Smoking status: Never Smoker   . Smokeless tobacco: Never Used  . Alcohol Use: No     Comment: very little  . Drug Use: No  . Sexual Activity: Not on file   Other Topics Concern  . Not on file   Social History Narrative   Daughter in [place]. 4 other children ([place], [place], [place], [place] to [place])   Has living will   Wife is health care POA (then daughter Fleet Contras)   Would accept resuscitation attempts   Probably would not want tube feeds if cognitively unaware    FAMILY HISTORY:   Family Status  Relation Status Death Age  . Mother Deceased 61    CHF  . Father Deceased 63    MI, ET  . Brother Deceased     ?liver cancer  . Brother Alive     DM-2, CAD  . Child Alive     5, alive and well    ROS:  A complete 10 system review of systems was obtained and was unremarkable apart from what is mentioned above.  PHYSICAL EXAMINATION:    VITALS:   Filed Vitals:   04/04/13 0856  BP: 102/60  Pulse: 60  Temp: 97.5 F (36.4 C)  Resp: 20  Weight: 172 lb 6.4 oz (78.2 kg)    GEN:  The patient appears stated age and is in NAD. HEENT:  Normocephalic, atraumatic.  The mucous membranes are moist. The superficial temporal arteries are without ropiness or tenderness. CV:  RRR with 3/6 SEM Lungs:  CTAB Neck/HEME:  There are no carotid bruits  bilaterally.  Neurological examination:  Orientation: A MoCA was done today, 01/01/13, and the patient scored a 21/30. Cranial nerves: There is good facial symmetry. Pupils are equal round and reactive to light bilaterally. Fundoscopic exam reveals clear margins bilaterally. Extraocular muscles are intact. The visual fields are full to confrontational testing. The speech is fluent and clear. Soft palate rises symmetrically and there is no tongue deviation. Hearing is intact to conversational tone. Sensation: Sensation is intact to light and pinprick throughout (facial, trunk, extremities). Vibration is intact at the bilateral big toe. There is no consistent extinction with double simultaneous stimulation. There is no sensory dermatomal level identified. Motor: Strength is 5/5 in the bilateral upper and lower extremities.   Shoulder shrug is equal and symmetric.  There is no pronator drift. Deep tendon reflexes: Deep tendon reflexes are 2/4 at the bilateral biceps, triceps, brachioradialis, patella and 1/4 at the bilateral achilles. Plantar responses are downgoing bilaterally.  Movement examination: Tone: There is increased tone in the bilateral upper extremities, mild. Abnormal movements: UE resting tremor is present bilaterally, L>R and in the L foot. Coordination:  There is mild decremation with RAM's, seen more on the L than R. he is able to perform heel taps fairly well, but has trouble with toe taps. Gait and Station: The patient has minimal difficulty arising out of a  deep-seated chair without the use of the hands.  The patient has a "marching" type of gait, with short steps.    LABS  Lab Results  Component Value Date   WBC 5.5 10/13/2012   HGB 13.4 10/13/2012   HCT 39.4 10/13/2012   MCV 94.1 10/13/2012   PLT 208.0 10/13/2012     Chemistry      Component Value Date/Time   NA 136 10/13/2012 1058   K 4.1 10/13/2012 1058   CL 100 10/13/2012 1058   CO2 28 10/13/2012 1058   BUN 24* 10/13/2012  1058   CREATININE 1.3 10/13/2012 1058      Component Value Date/Time   CALCIUM 9.4 10/13/2012 1058   ALKPHOS 49 10/13/2012 1058   AST 17 10/13/2012 1058   ALT 9 10/13/2012 1058   BILITOT 0.8 10/13/2012 1058     Lab Results  Component Value Date   TSH 2.92 10/13/2012     ASSESSMENT/PLAN:  1.  ET/PD  -I do not change his carbidopa/levodopa today, but he wonders if it works.  Therefore, I am actually going to hold it for almost 24 hours before next visit.  He will hold his Comtan as well.  Interestingly, he thought that the Comtan and Mirapex were helpful agents, but I explained him that Comtan does nothing except slow to breakdown of levodopa.  -For now, I will keep him on the Mirapex 0.25 mg-3 tablets twice a day.  He was not orthostatic in the office today and seems to be doing much better now that terazosin has been discontinued.  We will likely need to continue weaning this in the future because of cognitive changes, but he seems to be a good balance right now.  I talked to him about this today.  He feels that the Mirapex works better than anything..  -He was encouraged to exercise.  [facility].  -He does have rather significant tremor, likely because he had essential tremor long before the diagnosis of Parkinson's disease.  He has never tried a tremor drug.  We talked about this possibility, but ultimately he decided he did not want to try further medication, which I think is reasonable. 2.  Cognitive changes.  -We talked about the cognitive disorders associated with Parkinson's disease.  Talked about the fact that medication can exacerbate this, as above.  -He thought that the Exelon patch made him worse.  His cognition has been stable and we decided not to add anything today. 3.  probable orthostatic hypotension.  -Again, this is likely related to Parkinson's.  The discontinuation of terazosin and decrease of Mirapex seems to have helped.   4.   followup with me will be in the next few months, sooner should new neurologic issues arise.

## 2013-06-06 ENCOUNTER — Ambulatory Visit (INDEPENDENT_AMBULATORY_CARE_PROVIDER_SITE_OTHER): Payer: Medicare Other

## 2013-06-06 ENCOUNTER — Ambulatory Visit (INDEPENDENT_AMBULATORY_CARE_PROVIDER_SITE_OTHER): Payer: Medicare Other | Admitting: Podiatry

## 2013-06-06 ENCOUNTER — Encounter: Payer: Self-pay | Admitting: Podiatry

## 2013-06-06 VITALS — BP 115/59 | HR 73 | Resp 16 | Ht 66.0 in | Wt 170.0 lb

## 2013-06-06 DIAGNOSIS — M79609 Pain in unspecified limb: Secondary | ICD-10-CM

## 2013-06-06 DIAGNOSIS — M79673 Pain in unspecified foot: Secondary | ICD-10-CM | POA: Insufficient documentation

## 2013-06-06 DIAGNOSIS — B351 Tinea unguium: Secondary | ICD-10-CM

## 2013-06-06 DIAGNOSIS — M19079 Primary osteoarthritis, unspecified ankle and foot: Secondary | ICD-10-CM

## 2013-06-06 DIAGNOSIS — M79672 Pain in left foot: Secondary | ICD-10-CM

## 2013-06-06 DIAGNOSIS — M7750 Other enthesopathy of unspecified foot: Secondary | ICD-10-CM

## 2013-06-07 NOTE — Progress Notes (Signed)
[name] Rich presents today as an 79 year old white male with a chief complaint of pain to his hallux left. He states that it is been this way for quite some time since be getting worse. He also has pain in his toenails. He states is unable to cut his nails.  Objective: Pulses remain palpable bilateral lower extremity. Nails are thick yellow dystrophic clinically mycotic. He has pain on palpation and range of motion of the first metatarsophalangeal joint which is severely limited. Radiographic evaluation does demonstrates severe osteoarthritis to the first metatarsophalangeal joint only. Possible traumatic origin. Nails are thick yellow dystrophic clinically mycotic.  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 Pain in limb secondary to onychomycosis bilateral.  Plan: Debridement of nails 1 through 5 bilateral. Injection dexamethasone first metatarsophalangeal joint left. Followup with him in 3 months

## 2013-07-16 ENCOUNTER — Ambulatory Visit: Payer: Medicare Other | Admitting: Neurology

## 2013-07-16 ENCOUNTER — Encounter: Payer: Self-pay | Admitting: Neurology

## 2013-07-16 ENCOUNTER — Telehealth: Payer: Self-pay | Admitting: Internal Medicine

## 2013-07-16 NOTE — Telephone Encounter (Signed)
Pt no showed today's follow up appt. A no show letter has been mailed to the patient / [name] S.

## 2013-07-18 ENCOUNTER — Telehealth: Payer: Self-pay

## 2013-07-18 NOTE — Telephone Encounter (Signed)
Decrease the mirapex from 3 po tid (confirm that) to 2 po tid.

## 2013-07-18 NOTE — Telephone Encounter (Signed)
I called pt's wife and she confirmed that he is taking 3 PO BID of Mirapex and I told her Dr.Tat want's him to decrease to 2 PO BID. She agreed and will call us in a couple of days to give an update on his condition.

## 2013-07-18 NOTE — Telephone Encounter (Signed)
Pt's wife called to inform Dr.Tat that pt is having a lot of mental confusion. He has an appointment 07/24/2013 but is there anything they can do before the appointment to help with this issue?

## 2013-07-24 ENCOUNTER — Ambulatory Visit (INDEPENDENT_AMBULATORY_CARE_PROVIDER_SITE_OTHER): Payer: Medicare Other | Admitting: Neurology

## 2013-07-24 ENCOUNTER — Ambulatory Visit: Payer: Medicare Other | Admitting: Neurology

## 2013-07-24 ENCOUNTER — Encounter: Payer: Self-pay | Admitting: Neurology

## 2013-07-24 ENCOUNTER — Telehealth: Payer: Self-pay | Admitting: Neurology

## 2013-07-24 VITALS — BP 110/60 | HR 78 | Temp 97.5°F | Resp 12 | Ht 66.0 in | Wt 167.6 lb

## 2013-07-24 DIAGNOSIS — F0280 Dementia in other diseases classified elsewhere without behavioral disturbance: Secondary | ICD-10-CM

## 2013-07-24 DIAGNOSIS — G20 Parkinson's disease: Secondary | ICD-10-CM

## 2013-07-24 NOTE — Patient Instructions (Signed)
1.  Decrease mirapex 0.25 mg to 1 tablet in the AM, 2 at night for a week, then 1 tablet twice a day for a week, then 1  Tablet daily for a week and then stop the mirapex

## 2013-07-24 NOTE — Telephone Encounter (Signed)
Appt notes changed on 08/2013 to reflect the need for MoCA prior to seeing Dr Tat.

## 2013-07-24 NOTE — Progress Notes (Signed)
[name] Rich was seen today in the movement disorders clinic for neurologic consultation at the request of [name], MD.  The consultation is for the evaluation of Parkinsons disease.  The patient has previously seen Dr. [name].  I received and reviewed those records This patient is accompanied in the office by his spouse who supplements the history.  The patient reports that his first PD sx was when he was living in [place] perhaps in the year 2000.  He had L hand tremor.  He had a long hx of ET but this was a new resting tremor for him.  He was told he had PD.  He started on levodopa not long thereafter.  He did not think that the levodopa helped and he still is not sure that it helps.  However, he states that on one time he had stopped comtan and the tremor restarted.  He states that he restarted his comtan and within 20 min tremor was better.  Today, he tells me the same story but relates that the improvement was with the Mirapex and not the Comtan.    He takes levodopa 4 times per day.  He is on immediate release formulation.  Last visit, I did feel that the patient was underdosed.  After reviewing his [facility] records, they also felt that he was underdosed but the patient did not want to change his medication.  Interestingly, the patient recently called because of lightheadedness and wanted to go off of the levodopa.  However, I told him that while the levodopa can cause this, the Mirapex was likely a greater offender.  We told him that he decrease the Mirapex, but he ended up discontinuing it without a wean and felt terrible.  When we checked in with him a few days later, we told him to go back on the medication but at such a high dosage.  He was to go back to 0.75 mg twice a day and he is on that now.  Lightheadedness has improved greatly but he still had one episode where BP dropped to the mid 70s over 40s.  I noticed his primary care doctor address this and try to change his terazosin to tamsulosin.   The patient does not recall this and comes in today on terazosin.  He has no wearing off.    He has freezing in closets and when he turns, but he never really gets stuck.  He does admit to frustration that tremor was much better controlled years ago.    Update 01/01/13:  Since last visit, he did see his primary care physician.  I reviewed the notes.  He was placed on Exelon patch but he did not take it because it was too costly.  His primary care physician did discontinue his terazosin.  We have been working on weaning his Mirapex because orthostatic hypotension and cognitive changes.  He is currently on Mirapex 0.25 mg, 3 tablets twice a day.  His dizziness has been much better since the terazosin was discontinued.  He is also on carbidopa/levodopa 25/100 on the following schedule: 2/1.5/2/1.5.  He is going to PT in [place].  He has only been one time for an evaluation.  He is scheduled to go two days per week, for PT and OT.    He notes that his L hand has been been falling asleep, and involves all fingers but not the thumb.    04/04/13 update:    The patient is following up today regarding his ET/PD.  He is currently on carbidopa/levodopa  25/100 on the following schedule: 2/1.5/2/1.5.  Last visit, we added carbidopa/levodopa 50/200 CR at bedtime.  He remains on pramipexole 0.25 mg, 3 tablets twice per day.  He has been to physical therapy and thinks that that helps.  He is involved with the dance for PD study through [facility] and is enjoying it and thinks it helps.  I added the Exelon patch last visit because of cognitive changes, but he felt that it made his Parkinson's symptoms worse.  He stopped it.  He is not exercising.  Cognitively, he is doing well.  He is still able to play bridge with his friends, although the tremor does interfere with holding the cards.  He did fall yesterday when he was going up a curb, but his wife caught him before he hit the ground.  He has had no other falls.  He has had no  hallucinations.  He did have one very vivid dreaming he screamed out.  07/24/13 update:  The pt is f/u today, accompanied by his wife who supplements the hx.  He ran out of the effexor and d/c it, which was detrimental according to the wife.  He c/o weakness and dizziness.  He has had more confusion.  He doesn't drive.  He had a near fall yesterday but no real falls since last visit.  He was supposed to hold the mirapex and levodopa prior to todays visit but forgot.  I did decrease the mirapex from 0.75 mg bid to 0.5 mg bid and he has been some better mentally but he is recovering from the flu and his wife isn't sure if that's why she saw the improvement.  He is on carbidopa/levodopa 25/100, 2 in the AM, 1.5 in the afternoon, 2 in the evening and he may or may not take another 1.5 at bed.  He was supposed to be on the carbidopa/levodopa 50/200 at night but got confused and d/c that.  He is not exercising.  He is painting which his wife states has helped mentally as well.       PREVIOUS MEDICATIONS: Sinemet and Mirapex, comtan, Exelon patch (felt that he made Parkinson's symptoms worse)  ALLERGIES:   Allergies  Allergen Reactions  . Morphine And Related     Passed out in hospital  . Xylocaine-Epinephrine [Lidocaine-Epinephrine]   . Exelon [Rivastigmine]     Felt "real bad"    CURRENT MEDICATIONS:  Current Outpatient Prescriptions on File Prior to Visit  Medication Sig Dispense Refill  . aspirin 81 MG tablet Take 81 mg by mouth daily.      . carbidopa-levodopa (SINEMET CR) 25-100 MG per tablet Take 7 tablets by mouth as directed.      . carbidopa-levodopa (SINEMET CR) 50-200 MG per tablet Take 1 tablet by mouth at bedtime.  90 tablet  3  . cholecalciferol (VITAMIN D) 1000 UNITS tablet Take 1,000 Units by mouth daily.       Marland Kitchen docusate sodium (COLACE) 100 MG capsule Take 100 mg by mouth 2 (two) times daily.      . entacapone (COMTAN) 200 MG tablet Take 200 mg by mouth 4 (four) times daily.      Marland Kitchen  ibuprofen (ADVIL,MOTRIN) 200 MG tablet Take 200-400 mg by mouth 3 (three) times daily as needed.      . lovastatin (MEVACOR) 40 MG tablet Take 0.5 tablets (20 mg total) by mouth at bedtime.  15 tablet  0  . pramipexole (MIRAPEX) 0.25 MG  tablet Take 0.75 mg by mouth 2 (two) times daily.       Marland Kitchen. RANEXA 500 MG 12 hr tablet TAKE ONE TABLET TWICE A DAY  60 tablet  11  . tamsulosin (FLOMAX) 0.4 MG CAPS Take 1 capsule (0.4 mg total) by mouth daily.  30 capsule  11  . venlafaxine (EFFEXOR) 75 MG tablet Take 75 mg by mouth daily.       No current facility-administered medications on file prior to visit.    PAST MEDICAL HISTORY:   Past Medical History  Diagnosis Date  . Hyperlipidemia   . Coronary artery disease 2010    Dr Gwen PoundsKowalski  . Anxiety   . Osteoarthritis of foot   . Parkinson's disease     [facility]Stacy at [facility] - [facility]  . BPH (benign prostatic hypertrophy)   . Essential tremor   . Osteoporosis 2012    forearm only    PAST SURGICAL HISTORY:   Past Surgical History  Procedure Laterality Date  . Coronary stent placement  3/10  . Cholecystectomy    . Tonsillectomy and adenoidectomy  childhood  . Lumbosacral fusion  1978  . Joint replacement  1999    Left knee TKR  . Carpal tunnel release      left  . Cataract extraction      right    SOCIAL HISTORY:   History   Social History  . Marital Status: Married    Spouse Name: N/A    Number of Children: 5  . Years of Education: N/A   Occupational History  . Retired Insurance underwriterschool principal and superintendent    Social History Main Topics  . Smoking status: Never Smoker   . Smokeless tobacco: Never Used  . Alcohol Use: No     Comment: very little  . Drug Use: No  . Sexual Activity: Not on file   Other Topics Concern  . Not on file   Social History Narrative   Daughter in [place]. 4 other children ([place], [place], [place], [place]--moving to [place])   Has living will   Wife is health care POA (then daughter Fleet ContrasRachel)   Would  accept resuscitation attempts   Probably would not want tube feeds if cognitively unaware    FAMILY HISTORY:   Family Status  Relation Status Death Age  . Mother Deceased 8090    CHF  . Father Deceased [id number]    MI, ET  . Brother Deceased     ?liver cancer  . Brother Alive     DM-2, CAD  . Child Alive     5, alive and well    ROS:  Chronic L foot pain.  A complete 10 system review of systems was obtained and was unremarkable apart from what is mentioned above.  PHYSICAL EXAMINATION:    VITALS:   Filed Vitals:   07/24/13 0847 07/24/13 0909  BP: 82/50 110/60  Pulse: 78   Temp: 97.5 F (36.4 C)   Resp: 12   Height: 5\' 6"  (1.676 m)   Weight: 167 lb 9.6 oz (76.023 kg)     GEN:  The patient appears stated age and is in NAD. HEENT:  Normocephalic, atraumatic.  The mucous membranes are moist. The superficial temporal arteries are without ropiness or tenderness. CV:  RRR with 3/6 SEM Lungs:  CTAB Neck/HEME:  There are no carotid bruits bilaterally.  Neurological examination:  Orientation: A MoCA was done 01/01/13, and the patient scored a 21/30.  He is oriented today  but looks to his wife for the finer details. Cranial nerves: There is good facial symmetry. Pupils are equal round and reactive to light bilaterally. Fundoscopic exam reveals clear margins bilaterally. Extraocular muscles are intact. The visual fields are full to confrontational testing. The speech is fluent and clear. Soft palate rises symmetrically and there is no tongue deviation. Hearing is intact to conversational tone. Sensation: Sensation is intact to light and pinprick throughout (facial, trunk, extremities). Vibration is intact at the bilateral big toe. There is no consistent extinction with double simultaneous stimulation. There is no sensory dermatomal level identified. Motor: Strength is 5/5 in the bilateral upper and lower extremities.   Shoulder shrug is equal and symmetric.  There is no pronator drift. Deep  tendon reflexes: Deep tendon reflexes are 2/4 at the bilateral biceps, triceps, brachioradialis, patella and 1/4 at the bilateral achilles. Plantar responses are downgoing bilaterally.  Movement examination: Tone: There is increased tone in the bilateral upper extremities, mild. Abnormal movements: UE resting tremor is present bilaterally, L>R (but less than previous) and in the L foot. Coordination:  There is mild decremation with RAM's, seen more on the L than R. he is able to perform heel taps fairly well, but has trouble with toe taps. Gait and Station: The patient has minimal difficulty arising out of a deep-seated chair without the use of the hands.  The patient has short steps and is shuffling more  LABS  Lab Results  Component Value Date   WBC 5.5 10/13/2012   HGB 13.4 10/13/2012   HCT 39.4 10/13/2012   MCV 94.1 10/13/2012   PLT 208.0 10/13/2012     Chemistry      Component Value Date/Time   NA 136 10/13/2012 1058   K 4.1 10/13/2012 1058   CL 100 10/13/2012 1058   CO2 28 10/13/2012 1058   BUN 24* 10/13/2012 1058   CREATININE 1.3 10/13/2012 1058      Component Value Date/Time   CALCIUM 9.4 10/13/2012 1058   ALKPHOS 49 10/13/2012 1058   AST 17 10/13/2012 1058   ALT 9 10/13/2012 1058   BILITOT 0.8 10/13/2012 1058     Lab Results  Component Value Date   TSH 2.92 10/13/2012     ASSESSMENT/PLAN:  1.  ET/PD  -His wife recently started taking over the medication, which I think is a good idea.  For now he will remain on the carbidopa/levodopa 25/100 on the following schedule: 2/1.5/2/1.5.  We are going to hold back on restarting the carbidopa/levodopa 50/200 that he accidentally discontinued.  -I am going to slowly wean him off of the Mirapex.  I am worried this is contributing to cognitive change.  -He was encouraged to exercise.  I e-mailed our neuro rehabilitation center to see when he can have therapy. 2.  Cognitive changes.  -We talked about the cognitive disorders associated with  Parkinson's disease.  Talked about the fact that medication can exacerbate this, as above.  -He thought that the Exelon patch made him worse.    -MoCA will likely be repeated next visit. 3.  probable orthostatic hypotension.  -Again, this is likely related to Parkinson's.  The discontinuation of terazosin and decrease of Mirapex seems to have helped.  His BP was initially low today but I retook it and it was better.  I hope the d/c of mirapex will help. 4.  Depression  -He may need the effexor again but we are waiting since we are making other changes. 5.  followup  with me will be in the next few months, sooner should new neurologic issues arise.

## 2013-07-24 NOTE — Telephone Encounter (Signed)
Message copied by Silvio PateMCCRACKEN, JADE L on Tue Jul 24, 2013 10:04 AM ------      Message from: TAT, REBECCA S      Created: Tue Jul 24, 2013  9:44 AM       Pt needs MoCA next time he comes back before visit.            Thanks! ------

## 2013-07-31 ENCOUNTER — Other Ambulatory Visit: Payer: Self-pay

## 2013-07-31 DIAGNOSIS — G20 Parkinson's disease: Secondary | ICD-10-CM

## 2013-07-31 DIAGNOSIS — G20A1 Parkinson's disease without dyskinesia, without mention of fluctuations: Secondary | ICD-10-CM

## 2013-08-01 ENCOUNTER — Ambulatory Visit: Payer: Medicare Other | Admitting: Podiatry

## 2013-08-02 ENCOUNTER — Telehealth: Payer: Self-pay | Admitting: Neurology

## 2013-08-02 NOTE — Telephone Encounter (Signed)
Message copied by Silvio PateMCCRACKEN, JADE L on Thu Aug 02, 2013  4:10 PM ------      Message from: TAT, REBECCA S      Created: Thu Aug 02, 2013  4:04 PM       Pt needs MoCA tomorrow before I see him please.            Thx ------

## 2013-08-02 NOTE — Telephone Encounter (Signed)
Note added to patient's visit for him to have MoCA screening tomorrow.

## 2013-08-03 ENCOUNTER — Encounter: Payer: Self-pay | Admitting: Neurology

## 2013-08-03 ENCOUNTER — Other Ambulatory Visit: Payer: Self-pay

## 2013-08-03 ENCOUNTER — Ambulatory Visit (INDEPENDENT_AMBULATORY_CARE_PROVIDER_SITE_OTHER): Payer: Medicare Other | Admitting: Neurology

## 2013-08-03 VITALS — BP 100/52 | HR 76 | Temp 97.6°F | Resp 14 | Ht 66.0 in | Wt 167.9 lb

## 2013-08-03 DIAGNOSIS — G20 Parkinson's disease: Secondary | ICD-10-CM

## 2013-08-03 DIAGNOSIS — F0280 Dementia in other diseases classified elsewhere without behavioral disturbance: Secondary | ICD-10-CM

## 2013-08-03 MED ORDER — CARBIDOPA-LEVODOPA ER 50-200 MG PO TBCR: 1.0000 | EXTENDED_RELEASE_TABLET | Freq: Every day | ORAL | Status: DC

## 2013-08-03 NOTE — Patient Instructions (Addendum)
1.  Take your carbidopa/levodopa 25/100 as follows:  2 at 7 am, 1.5 tablets at 10 am, 2 at 1 pm, and 1.5 tablets at 4 pm.  Take your entacapone - 200mg  - one tablet at 7am/10am/1pm/4pm (one with each carbidopa/levodopa 25/100 dose) 2.  Take your carbidopa/levodopa 09811 CR at bedtime 3.  Continue weaning the mirapex 4.  Make a commitment to exercise at the fitness center

## 2013-08-03 NOTE — Progress Notes (Signed)
[name] BowieDavid Rich was seen today in the movement disorders clinic for neurologic consultation at the request of [name] Abideichard Letvak, MD.  The consultation is for the evaluation of Parkinsons disease.  The patient has previously seen Dr. [name].  I received and reviewed those records This patient is accompanied in the office by his spouse who supplements the history.  The patient reports that his first PD sx was when he was living in [place] perhaps in the year 2000.  He had L hand tremor.  He had a long hx of ET but this was a new resting tremor for him.  He was told he had PD.  He started on levodopa not long thereafter.  He did not think that the levodopa helped and he still is not sure that it helps.  However, he states that on one time he had stopped comtan and the tremor restarted.  He states that he restarted his comtan and within 20 min tremor was better.  Today, he tells me the same story but relates that the improvement was with the Mirapex and not the Comtan.    He takes levodopa 4 times per day.  He is on immediate release formulation.  Last visit, I did feel that the patient was underdosed.  After reviewing his [facility] records, they also felt that he was underdosed but the patient did not want to change his medication.  Interestingly, the patient recently called because of lightheadedness and wanted to go off of the levodopa.  However, I told him that while the levodopa can cause this, the Mirapex was likely a greater offender.  We told him that he decrease the Mirapex, but he ended up discontinuing it without a wean and felt terrible.  When we checked in with him a few days later, we told him to go back on the medication but at such a high dosage.  He was to go back to 0.75 mg twice a day and he is on that now.  Lightheadedness has improved greatly but he still had one episode where BP dropped to the mid 70s over 40s.  I noticed his primary care doctor address this and try to change his terazosin to tamsulosin.   The patient does not recall this and comes in today on terazosin.  He has no wearing off.    He has freezing in closets and when he turns, but he never really gets stuck.  He does admit to frustration that tremor was much better controlled years ago.    Update 01/01/13:  Since last visit, he did see his primary care physician.  I reviewed the notes.  He was placed on Exelon patch but he did not take it because it was too costly.  His primary care physician did discontinue his terazosin.  We have been working on weaning his Mirapex because orthostatic hypotension and cognitive changes.  He is currently on Mirapex 0.25 mg, 3 tablets twice a day.  His dizziness has been much better since the terazosin was discontinued.  He is also on carbidopa/levodopa 25/100 on the following schedule: 2/1.5/2/1.5.  He is going to PT in [place].  He has only been one time for an evaluation.  He is scheduled to go two days per week, for PT and OT.    He notes that his L hand has been been falling asleep, and involves all fingers but not the thumb.    04/04/13 update:    The patient is following up today regarding his ET/PD.  He is currently on carbidopa/levodopa  25/100 on the following schedule: 2/1.5/2/1.5.  Last visit, we added carbidopa/levodopa 50/200 CR at bedtime.  He remains on pramipexole 0.25 mg, 3 tablets twice per day.  He has been to physical therapy and thinks that that helps.  He is involved with the dance for PD study through [facility] and is enjoying it and thinks it helps.  I added the Exelon patch last visit because of cognitive changes, but he felt that it made his Parkinson's symptoms worse.  He stopped it.  He is not exercising.  Cognitively, he is doing well.  He is still able to play bridge with his friends, although the tremor does interfere with holding the cards.  He did fall yesterday when he was going up a curb, but his wife caught him before he hit the ground.  He has had no other falls.  He has had no  hallucinations.  He did have one very vivid dreaming he screamed out.  07/24/13 update:  The pt is f/u today, accompanied by his wife who supplements the hx.  He ran out of the effexor and d/c it, which was detrimental according to the wife.  He c/o weakness and dizziness.  He has had more confusion.  He doesn't drive.  He had a near fall yesterday but no real falls since last visit.  He was supposed to hold the mirapex and levodopa prior to todays visit but forgot.  I did decrease the mirapex from 0.75 mg bid to 0.5 mg bid and he has been some better mentally but he is recovering from the flu and his wife isn't sure if that's why she saw the improvement.  He is on carbidopa/levodopa 25/100, 2 in the AM, 1.5 in the afternoon, 2 in the evening and he may or may not take another 1.5 at bed.  He was supposed to be on the carbidopa/levodopa 50/200 at night but got confused and d/c that.  He is not exercising.  He is painting which his wife states has helped mentally as well.      08/03/14 update:  The pt was seen much earlier than anticipated.  He is accompanied by his wife who supplements the hx.  10 days ago, I started weaning him off of his mirapex.  He is on carbidopa/levodopa 25/100, 2 in the AM, 1.5 in the afternoon, 2 in the evening and he may or may not take another 1.5 at bed.  He is concerned and wanted to be seen earlier than scheduled because it takes him about an hour and a half to be able to feel good in the morning.  Before that, his proximal legs and pelvic muscles feel weak.  In addition, he complains of shortness of breath until his dopamine kicks in.  Memory seems like it dramatically deteriorated after he had the flu at the end of last year.  His wife does state that he has gotten somewhat better, but he still is not able to play bridge like he was.   PREVIOUS MEDICATIONS: Sinemet and Mirapex, comtan, Exelon patch (felt that he made Parkinson's symptoms worse); sinemet CR at bed (accidentally d/c  the med and just not restarted)  ALLERGIES:   Allergies  Allergen Reactions  . Morphine And Related     Passed out in hospital  . Xylocaine-Epinephrine [Lidocaine-Epinephrine]   . Exelon [Rivastigmine]     Felt "real bad"    CURRENT MEDICATIONS:  Current Outpatient Prescriptions on File Prior to  Visit  Medication Sig Dispense Refill  . aspirin 81 MG tablet Take 81 mg by mouth daily.      . carbidopa-levodopa (SINEMET CR) 25-100 MG per tablet Take 7 tablets by mouth as directed.      . carbidopa-levodopa (SINEMET CR) 50-200 MG per tablet Take 1 tablet by mouth at bedtime.  90 tablet  3  . cholecalciferol (VITAMIN D) 1000 UNITS tablet Take 1,000 Units by mouth daily.       Marland Kitchen docusate sodium (COLACE) 100 MG capsule Take 100 mg by mouth 2 (two) times daily.      . entacapone (COMTAN) 200 MG tablet Take 200 mg by mouth 4 (four) times daily.      Marland Kitchen ibuprofen (ADVIL,MOTRIN) 200 MG tablet Take 200-400 mg by mouth 3 (three) times daily as needed.      . lovastatin (MEVACOR) 40 MG tablet Take 0.5 tablets (20 mg total) by mouth at bedtime.  15 tablet  0  . pramipexole (MIRAPEX) 0.25 MG tablet Take 0.75 mg by mouth 2 (two) times daily.       Marland Kitchen RANEXA 500 MG 12 hr tablet TAKE ONE TABLET TWICE A DAY  60 tablet  11  . tamsulosin (FLOMAX) 0.4 MG CAPS Take 1 capsule (0.4 mg total) by mouth daily.  30 capsule  11  . venlafaxine (EFFEXOR) 75 MG tablet Take 75 mg by mouth daily.       No current facility-administered medications on file prior to visit.    PAST MEDICAL HISTORY:   Past Medical History  Diagnosis Date  . Hyperlipidemia   . Coronary artery disease 2010    Dr Gwen Pounds  . Anxiety   . Osteoarthritis of foot   . Parkinson's disease     Dr Kennyth Arnold at [facility] - 2000  . BPH (benign prostatic hypertrophy)   . Essential tremor   . Osteoporosis 2012    forearm only    PAST SURGICAL HISTORY:   Past Surgical History  Procedure Laterality Date  . Coronary stent placement  3/10  .  Cholecystectomy    . Tonsillectomy and adenoidectomy  childhood  . Lumbosacral fusion  1978  . Joint replacement  1999    Left knee TKR  . Carpal tunnel release      left  . Cataract extraction      right    SOCIAL HISTORY:   History   Social History  . Marital Status: Married    Spouse Name: N/A    Number of Children: 5  . Years of Education: N/A   Occupational History  . Retired Insurance underwriter    Social History Main Topics  . Smoking status: Never Smoker   . Smokeless tobacco: Never Used  . Alcohol Use: No     Comment: very little  . Drug Use: No  . Sexual Activity: Not on file   Other Topics Concern  . Not on file   Social History Narrative   Daughter in [place]. 4 other children ([place], [place], , [place] to [place])   Has living will   Wife is health care POA (then daughter Fleet Contras)   Would accept resuscitation attempts   Probably would not want tube feeds if cognitively unaware    FAMILY HISTORY:   Family Status  Relation Status Death Age  . Mother Deceased 55    CHF  . Father Deceased 42    MI, ET  . Brother Deceased     ?liver cancer  .  Brother Alive     DM-2, CAD  . Child Alive     5, alive and well    ROS:  Chronic L foot pain.  A complete 10 system review of systems was obtained and was unremarkable apart from what is mentioned above.  PHYSICAL EXAMINATION:    VITALS:   Filed Vitals:   08/03/13 1331  BP: 100/52  Pulse: 76  Temp: 97.6 F (36.4 C)  Resp: 14  Height: 5\' 6"  (1.676 m)  Weight: 167 lb 14.4 oz (76.159 kg)    GEN:  The patient appears stated age and is in NAD. HEENT:  Normocephalic, atraumatic.  The mucous membranes are moist. The superficial temporal arteries are without ropiness or tenderness. CV:  RRR with 3/6 SEM Lungs:  CTAB Neck/HEME:  There are no carotid bruits bilaterally.  Neurological examination:  Orientation: A MoCA was done 01/01/13, and the patient scored a  21/30.  08/03/13 MoCA was also 21/30.  He is oriented today but looks to his wife for the finer details.   Movement examination: Tone: There is normal tone bilaterally today. Abnormal movements: UE resting tremor is present bilaterally, L>R (but less than previous) and in the L foot and leg Coordination:  There is mild decremation with RAM's, seen more on the L than R. he is able to perform heel taps fairly well, but has trouble with toe taps. Gait and Station: The patient has minimal difficulty arising out of a deep-seated chair without the use of the hands.  The patient has short steps and is shuffling more  LABS  Lab Results  Component Value Date   WBC 5.5 10/13/2012   HGB 13.4 10/13/2012   HCT 39.4 10/13/2012   MCV 94.1 10/13/2012   PLT 208.0 10/13/2012     Chemistry      Component Value Date/Time   NA 136 10/13/2012 1058   K 4.1 10/13/2012 1058   CL 100 10/13/2012 1058   CO2 28 10/13/2012 1058   BUN 24* 10/13/2012 1058   CREATININE 1.3 10/13/2012 1058      Component Value Date/Time   CALCIUM 9.4 10/13/2012 1058   ALKPHOS 49 10/13/2012 1058   AST 17 10/13/2012 1058   ALT 9 10/13/2012 1058   BILITOT 0.8 10/13/2012 1058     Lab Results  Component Value Date   TSH 2.92 10/13/2012     ASSESSMENT/PLAN:  1.  ET/PD  -His wife recently started taking over the medication, which I think is a good idea.  For now he will remain on the carbidopa/levodopa 25/100 on the following schedule: 2/1.5/2/1.5 but changed the timing to 7 AM/10 AM/1 PM/4 PM, with entacapone at each of these dosages.  I will restart him back on carbidopa/levodopa 50/200 that he accidentally discontinued.  -I am continue to slowly wean him off of the Mirapex.  I am worried this is contributing to cognitive change.  -He was encouraged to exercise.  I e-mailed our neuro rehabilitation center to see when he can have therapy. 2.  Cognitive changes.  -We talked about the cognitive disorders associated with Parkinson's disease.   Talked about the fact that medication can exacerbate this, as above.  -He thought that the Exelon patch made him worse.    -MoCA has been stable. 3.  probable orthostatic hypotension.  -Again, this is likely related to Parkinson's.  The discontinuation of terazosin and decrease of Mirapex seems to have helped.  His BP was initially low today but I retook it  and it was better.  I hope the d/c of mirapex will help. 4.  Depression  -He may need the effexor again but we are waiting since we are making other changes. 5.  followup with me will be in the next few months, sooner should new neurologic issues arise.

## 2013-08-06 ENCOUNTER — Ambulatory Visit: Payer: Medicare Other | Admitting: Neurology

## 2013-08-06 ENCOUNTER — Telehealth: Payer: Self-pay | Admitting: Neurology

## 2013-08-06 NOTE — Telephone Encounter (Signed)
Patient's wife called to check on medication dosage. Patient is taking carbidopa-levodopa 5 times daily and told to take entacapone with each dose. I clarified with patient's wife that he is to take the entacapone with his 4 doses of carbidopa-levodopa that are 25/100 mg. He is not to take the medication with his 50/200 mg dose. She understands and will call with any other questions.

## 2013-08-06 NOTE — Telephone Encounter (Signed)
Patient called and states his pharmacy never received his RX for Carbidopa-Levodopa 50-200 mg #90 with three refills prescribed at his visit on Friday. Under the RX it states it printed. RX called to [facility]. Patient will call back with any questions.

## 2013-08-13 ENCOUNTER — Telehealth: Payer: Self-pay | Admitting: Neurology

## 2013-08-13 ENCOUNTER — Telehealth: Payer: Self-pay | Admitting: *Deleted

## 2013-08-13 NOTE — Telephone Encounter (Signed)
That's no problem.  Just resume pill schedule as normal today.

## 2013-08-13 NOTE — Telephone Encounter (Signed)
Patient's husband called and said that patient took more then he should of Carbidopa. Patient stated husband went to exercise and ate but now he is feeling sleepy. Need advise on what to do, please advise

## 2013-08-13 NOTE — Telephone Encounter (Signed)
Advised patients wife of Dr Don Perkingat's recommendation and patients wife will contact PCP

## 2013-08-13 NOTE — Telephone Encounter (Signed)
Carbidopa 50-200 mg took normally at bedtime  Carbidopa 25-100 mg thinking he took at night as well because two of the morning pills he would have taken at 7 am and 10 am were gone

## 2013-08-13 NOTE — Telephone Encounter (Signed)
I advised patient's wife that per Dr. Arbutus Leas ok to resume pill schedule as normal today. Patient's wife verbalized understanding.  Patient's wife forgot to ask since her husband is Secondary school teacher tomorrow after that can he start back on his Effexor because he is having depression issues. Thank you, please advise

## 2013-08-13 NOTE — Telephone Encounter (Signed)
He probably can but should ask PCP since I didn't give this to him and have not really been managing it.

## 2013-08-13 NOTE — Telephone Encounter (Signed)
How much did he take?

## 2013-08-14 ENCOUNTER — Other Ambulatory Visit: Payer: Self-pay | Admitting: *Deleted

## 2013-08-14 MED ORDER — CARBIDOPA-LEVODOPA ER 50-200 MG PO TBCR: 1.0000 | EXTENDED_RELEASE_TABLET | Freq: Every day | ORAL | Status: DC

## 2013-08-14 MED ORDER — VENLAFAXINE HCL 75 MG PO TABS: 75.0000 mg | ORAL_TABLET | Freq: Every day | ORAL | Status: DC

## 2013-08-14 NOTE — Telephone Encounter (Signed)
Pt asking for 30 day supply of meds to last until she gets order from the [place]

## 2013-08-14 NOTE — Telephone Encounter (Signed)
Spoke with patient and advised rx ready for pick-up and it will be at the front desk.  

## 2013-08-20 ENCOUNTER — Telehealth: Payer: Self-pay | Admitting: Neurology

## 2013-08-20 NOTE — Telephone Encounter (Signed)
Pt's wife called requesting to speak to a nurse regarding meds.

## 2013-08-20 NOTE — Telephone Encounter (Addendum)
She is calling to state that they still have not gotten VA approval for second Carbidopa-Levodopa RX. They asked [place] to fax something stating why he needs this medication. Last office note faxed to Dr Tamera Punt at [phone]. Confirmation received.

## 2013-08-24 ENCOUNTER — Telehealth: Payer: Self-pay | Admitting: Neurology

## 2013-08-24 NOTE — Telephone Encounter (Signed)
Patient's wife called to let us know that patient is getting worse. I called back and spoke with patient. He states he has been feeling stiff and very shaky all day. He has been off the mirapex for a week and has been taking his other medication as directed. Please advise.

## 2013-08-24 NOTE — Telephone Encounter (Signed)
Can he come in next tues at 11:15?

## 2013-08-24 NOTE — Telephone Encounter (Signed)
I offered appt on Tuesday. They declined. Appt made for Thursday at 9:45 am.

## 2013-08-28 ENCOUNTER — Telehealth: Payer: Self-pay | Admitting: Neurology

## 2013-08-28 ENCOUNTER — Ambulatory Visit: Payer: Medicare Other | Admitting: Neurology

## 2013-08-28 MED ORDER — CLONAZEPAM 0.5 MG PO TABS: 0.2500 mg | ORAL_TABLET | Freq: Every day | ORAL | Status: DC

## 2013-08-28 NOTE — Telephone Encounter (Signed)
Hard to treat this over phone without seeing him.  Give him RX klonopin - 0.5 mg - 1/2 tablet 30 min prior to bedtime.  See if that helps him sleep better.

## 2013-08-28 NOTE — Telephone Encounter (Signed)
Pt's daughter called stating that Mr. [name] Rich' parkinson's is acting up even more. She states that it is worse in the AM and Is having trouble sleeping. According to PT everything has gotten worse. Pt's daughter would like to know if there is anything that can Be Rx before his f/u appt on this coming Thursday 08/30/13.   Please call the Pt.

## 2013-08-28 NOTE — Telephone Encounter (Signed)
I tried to offer appt today but his wife is having a procedure today. Not sure what else to offer patient.

## 2013-08-28 NOTE — Telephone Encounter (Signed)
Spoke with patient's wife and she would like [place] to call in Klonopin. RX called to [facility].

## 2013-08-30 ENCOUNTER — Encounter: Payer: Self-pay | Admitting: Neurology

## 2013-08-30 ENCOUNTER — Ambulatory Visit (INDEPENDENT_AMBULATORY_CARE_PROVIDER_SITE_OTHER): Payer: Medicare Other | Admitting: Neurology

## 2013-08-30 VITALS — BP 120/60 | HR 80 | Resp 18 | Ht 66.0 in | Wt 159.1 lb

## 2013-08-30 DIAGNOSIS — F329 Major depressive disorder, single episode, unspecified: Secondary | ICD-10-CM

## 2013-08-30 DIAGNOSIS — G4700 Insomnia, unspecified: Secondary | ICD-10-CM

## 2013-08-30 DIAGNOSIS — F0280 Dementia in other diseases classified elsewhere without behavioral disturbance: Secondary | ICD-10-CM

## 2013-08-30 DIAGNOSIS — F3289 Other specified depressive episodes: Secondary | ICD-10-CM

## 2013-08-30 DIAGNOSIS — G20 Parkinson's disease: Secondary | ICD-10-CM

## 2013-08-30 MED ORDER — CARBIDOPA-LEVODOPA ER 25-100 MG PO TBCR: 2.0000 | EXTENDED_RELEASE_TABLET | Freq: Once | ORAL | Status: DC

## 2013-08-30 MED ORDER — CARBIDOPA-LEVODOPA 25-100 MG PO TABS: 1.5000 | ORAL_TABLET | Freq: Four times a day (QID) | ORAL | Status: DC

## 2013-08-30 MED ORDER — TRANSPORT CHAIR MISC: Status: DC

## 2013-08-30 MED ORDER — CARBIDOPA-LEVODOPA 25-100 MG PO TABS
2.0000 | ORAL_TABLET | Freq: Once | ORAL | Status: AC
  Administered 2013-08-30: 2 via ORAL

## 2013-08-30 NOTE — Patient Instructions (Signed)
1. We have given you prescriptions for a lift chair and a shower chair.  2. We have given you an application for a handicap placard.  3. You have been referred to [facility]. They will call you directly to schedule an appointment.  Please call [phone] if you do not hear from them.  4. Keep your follow up in March.

## 2013-08-30 NOTE — Telephone Encounter (Signed)
Patient's daughter came to office. Carbidopa sent to pharmacy. She is having trouble with RX for lift chair. She will call me back with fax number to fax RX to medical supply store.

## 2013-08-30 NOTE — Progress Notes (Signed)
[name] Rich was seen today in the movement disorders clinic for neurologic consultation at the request of [name] Abideichard Letvak, MD.  The consultation is for the evaluation of Parkinsons disease.  The patient has previously seen Dr. Misty Rich.  I received and reviewed those records This patient is accompanied in the office by his spouse who supplements the history.  The patient reports that his first PD sx was when he was living in [place] perhaps in the year 2000.  He had L hand tremor.  He had a long hx of ET but this was a new resting tremor for him.  He was told he had PD.  He started on levodopa not long thereafter.  He did not think that the levodopa helped and he still is not sure that it helps.  However, he states that on one time he had stopped comtan and the tremor restarted.  He states that he restarted his comtan and within 20 min tremor was better.  Today, he tells me the same story but relates that the improvement was with the Mirapex and not the Comtan.    He takes levodopa 4 times per day.  He is on immediate release formulation.  Last visit, I did feel that the patient was underdosed.  After reviewing his [facility] records, they also felt that he was underdosed but the patient did not want to change his medication.  Interestingly, the patient recently called because of lightheadedness and wanted to go off of the levodopa.  However, I told him that while the levodopa can cause this, the Mirapex was likely a greater offender.  We told him that he decrease the Mirapex, but he ended up discontinuing it without a wean and felt terrible.  When we checked in with him a few days later, we told him to go back on the medication but at such a high dosage.  He was to go back to 0.75 mg twice a day and he is on that now.  Lightheadedness has improved greatly but he still had one episode where BP dropped to the mid 70s over 40s.  I noticed his primary care doctor address this and try to change his terazosin to tamsulosin.   The patient does not recall this and comes in today on terazosin.  He has no wearing off.    He has freezing in closets and when he turns, but he never really gets stuck.  He does admit to frustration that tremor was much better controlled years ago.    Update 01/01/13:  Since last visit, he did see his primary care physician.  I reviewed the notes.  He was placed on Exelon patch but he did not take it because it was too costly.  His primary care physician did discontinue his terazosin.  We have been working on weaning his Mirapex because orthostatic hypotension and cognitive changes.  He is currently on Mirapex 0.25 mg, 3 tablets twice a day.  His dizziness has been much better since the terazosin was discontinued.  He is also on carbidopa/levodopa 25/100 on the following schedule: 2/1.5/2/1.5.  He is going to PT in [place].  He has only been one time for an evaluation.  He is scheduled to go two days per week, for PT and OT.    He notes that his L hand has been been falling asleep, and involves all fingers but not the thumb.    04/04/13 update:    The patient is following up today regarding his ET/PD.  He is currently on carbidopa/levodopa  25/100 on the following schedule: 2/1.5/2/1.5.  Last visit, we added carbidopa/levodopa 50/200 CR at bedtime.  He remains on pramipexole 0.25 mg, 3 tablets twice per day.  He has been to physical therapy and thinks that that helps.  He is involved with the dance for PD study through [facility] and is enjoying it and thinks it helps.  I added the Exelon patch last visit because of cognitive changes, but he felt that it made his Parkinson's symptoms worse.  He stopped it.  He is not exercising.  Cognitively, he is doing well.  He is still able to play bridge with his friends, although the tremor does interfere with holding the cards.  He did fall yesterday when he was going up a curb, but his wife caught him before he hit the ground.  He has had no other falls.  He has had no  hallucinations.  He did have one very vivid dreaming he screamed out.  07/24/13 update:  The pt is f/u today, accompanied by his wife who supplements the hx.  He ran out of the effexor and d/c it, which was detrimental according to the wife.  He c/o weakness and dizziness.  He has had more confusion.  He doesn't drive.  He had a near fall yesterday but no real falls since last visit.  He was supposed to hold the mirapex and levodopa prior to todays visit but forgot.  I did decrease the mirapex from 0.75 mg bid to 0.5 mg bid and he has been some better mentally but he is recovering from the flu and his wife isn't sure if that's why she saw the improvement.  He is on carbidopa/levodopa 25/100, 2 in the AM, 1.5 in the afternoon, 2 in the evening and he may or may not take another 1.5 at bed.  He was supposed to be on the carbidopa/levodopa 50/200 at night but got confused and d/c that.  He is not exercising.  He is painting which his wife states has helped mentally as well.      08/03/14 update:  The pt was seen much earlier than anticipated.  He is accompanied by his wife who supplements the hx.  10 days ago, I started weaning him off of his mirapex.  He is on carbidopa/levodopa 25/100, 2 in the AM, 1.5 in the afternoon, 2 in the evening and he may or may not take another 1.5 at bed.  He is concerned and wanted to be seen earlier than scheduled because it takes him about an hour and a half to be able to feel good in the morning.  Before that, his proximal legs and pelvic muscles feel weak.  In addition, he complains of shortness of breath until his dopamine kicks in.  Memory seems like it dramatically deteriorated after he had the flu at the end of last year.  His wife does state that he has gotten somewhat better, but he still is not able to play bridge like he was.  08/30/13 update:  The patient is accompanied by his wife and daughter, [name] Rich, who supplement the history.  The patient is off of Mirapex now, but  unfortunately and not surprisingly, tremor increased.  This has become very bothersome for him.  Over the last week or so he has had more difficulty sleeping.  He did restart his Effexor about 10 days ago.  While the clonazepam definitely helped him sleep, his wife noticed that he seemed weaker  the morning after he took it.  It did not cause cognitive change, however.  In fact, since going off of the Mirapex, her cognition has been better.  He is currently taking carbidopa/levodopa 25/100, 2 tablets in the morning, 1.5 tablets in the afternoon, 2 tablets in the later afternoon and one and a half tablets in the late evening.  He remains on carbidopa/levodopa 50/200 at nighttime.  He is also on entacapone 4 times per day.   PREVIOUS MEDICATIONS: Sinemet and Mirapex, comtan, Exelon patch (felt that he made Parkinson's symptoms worse); sinemet CR at bed (accidentally d/c the med and just not restarted)  ALLERGIES:   Allergies  Allergen Reactions  . Morphine And Related     Passed out in hospital  . Xylocaine-Epinephrine [Lidocaine-Epinephrine]   . Exelon [Rivastigmine]     Felt "real bad"    CURRENT MEDICATIONS:  Current Outpatient Prescriptions on File Prior to Visit  Medication Sig Dispense Refill  . aspirin 81 MG tablet Take 81 mg by mouth daily.      . carbidopa-levodopa (SINEMET CR) 25-100 MG per tablet Take 7 tablets by mouth as directed.      . carbidopa-levodopa (SINEMET CR) 50-200 MG per tablet Take 1 tablet by mouth at bedtime.  90 tablet  3  . cholecalciferol (VITAMIN D) 1000 UNITS tablet Take 1,000 Units by mouth daily.       . clonazePAM (KLONOPIN) 0.5 MG tablet Take 0.5 tablets (0.25 mg total) by mouth daily.  30 tablet  0  . docusate sodium (COLACE) 100 MG capsule Take 100 mg by mouth 2 (two) times daily.      . entacapone (COMTAN) 200 MG tablet Take 200 mg by mouth 4 (four) times daily.      Marland Kitchen ibuprofen (ADVIL,MOTRIN) 200 MG tablet Take 200-400 mg by mouth 3 (three) times daily  as needed.      . lovastatin (MEVACOR) 40 MG tablet Take 0.5 tablets (20 mg total) by mouth at bedtime.  15 tablet  0  . RANEXA 500 MG 12 hr tablet TAKE ONE TABLET TWICE A DAY  60 tablet  11  . tamsulosin (FLOMAX) 0.4 MG CAPS Take 1 capsule (0.4 mg total) by mouth daily.  30 capsule  11  . venlafaxine (EFFEXOR) 75 MG tablet Take 1 tablet (75 mg total) by mouth daily.  15 tablet  0   No current facility-administered medications on file prior to visit.    PAST MEDICAL HISTORY:   Past Medical History  Diagnosis Date  . Hyperlipidemia   . Coronary artery disease 2010    Dr Gwen Pounds  . Anxiety   . Osteoarthritis of foot   . Parkinson's disease     Dr Kennyth Arnold at [facility] - 2000  . BPH (benign prostatic hypertrophy)   . Essential tremor   . Osteoporosis 2012    forearm only    PAST SURGICAL HISTORY:   Past Surgical History  Procedure Laterality Date  . Coronary stent placement  3/10  . Cholecystectomy    . Tonsillectomy and adenoidectomy  childhood  . Lumbosacral fusion  1978  . Joint replacement  1999    Left knee TKR  . Carpal tunnel release      left  . Cataract extraction      right    SOCIAL HISTORY:   History   Social History  . Marital Status: Married    Spouse Name: N/A    Number of Children: 5  . Years of  Education: N/A   Occupational History  . Retired Insurance underwriter    Social History Main Topics  . Smoking status: Never Smoker   . Smokeless tobacco: Never Used  . Alcohol Use: No     Comment: very little  . Drug Use: No  . Sexual Activity: Not on file   Other Topics Concern  . Not on file   Social History Narrative   Daughter in [place]. 4 other children ([place], [place], [place], [place] to [place])   Has living will   Wife is health care POA (then daughter Fleet Contras)   Would accept resuscitation attempts   Probably would not want tube feeds if cognitively unaware    FAMILY HISTORY:   Family Status  Relation Status  Death Age  . Mother Deceased 65    CHF  . Father Deceased 5    MI, ET  . Brother Deceased     ?liver cancer  . Brother Alive     DM-2, CAD  . Child Alive     5, alive and well    ROS:  Chronic L foot pain.  A complete 10 system review of systems was obtained and was unremarkable apart from what is mentioned above.  PHYSICAL EXAMINATION:    VITALS:   Filed Vitals:   08/30/13 0933  BP: 120/60  Pulse: 80  Resp: 18  Height: 5\' 6"  (1.676 m)  Weight: 159 lb 2 oz (72.179 kg)    GEN:  The patient appears stated age and is in NAD. HEENT:  Normocephalic, atraumatic.  The mucous membranes are moist. The superficial temporal arteries are without ropiness or tenderness. CV:  RRR with 3/6 SEM Lungs:  CTAB Neck/HEME:  There are no carotid bruits bilaterally.  Neurological examination:  Orientation: A MoCA was done 01/01/13, and the patient scored a 21/30.  08/03/13 MoCA was also 21/30.  He is oriented today but looks to his wife for the finer details.   Movement examination: Tone: There is normal tone bilaterally today. Abnormal movements: UE resting tremor is present bilaterally, L>R  and in the L foot.  It is moderate in nature.  After the addition of levodopa in the office dissolved in ginger ale (200 mg levodopa), tremor greatly decreased with only mild tremor in the LUE remaining. Coordination:  There is mild decremation with RAM's, seen more on the L than R. he is able to perform heel taps fairly well, but has trouble with toe taps. Gait and Station: The patient has difficulty arising out of a deep-seated chair without the use of the hands.  The patient has short steps and is shuffling more and freezes some.  After the addition of levodopa, turns were much better although in the doorway and in small spaces, steps were short.  In the hallway, steps were virtually normal size.    LABS  Lab Results  Component Value Date   WBC 5.5 10/13/2012   HGB 13.4 10/13/2012   HCT 39.4  10/13/2012   MCV 94.1 10/13/2012   PLT 208.0 10/13/2012     Chemistry      Component Value Date/Time   NA 136 10/13/2012 1058   K 4.1 10/13/2012 1058   CL 100 10/13/2012 1058   CO2 28 10/13/2012 1058   BUN 24* 10/13/2012 1058   CREATININE 1.3 10/13/2012 1058      Component Value Date/Time   CALCIUM 9.4 10/13/2012 1058   ALKPHOS 49 10/13/2012 1058   AST 17 10/13/2012 1058  ALT 9 10/13/2012 1058   BILITOT 0.8 10/13/2012 1058     Lab Results  Component Value Date   TSH 2.92 10/13/2012     ASSESSMENT/PLAN:  1.  ET/PD  -His wife recently started taking over the medication, which I think is a good idea.  For now he will increase the carbidopa/levodopa 25/100 on the following schedule: 2 tablets qid at the following times: 7 AM/10 AM/1 PM/4 PM, with entacapone at each of these dosages.  If he needs an extra, I told him that was fine, but not to go over 10 tablets per day.  Carbidopa/levodopa 50/200 CR will be continued at bedtime.  -I will send him for PT/ST  -Rx for lift chair/shower chair written  -Information given on [facility] services.   2.  Cognitive changes.  -We talked about the cognitive disorders associated with Parkinson's disease.  Talked about the fact that medication can exacerbate this, as above.  -He thought that the Exelon patch made him worse.    -MoCA has been stable.  Improved with d/c of mirapex.   3.  probable orthostatic hypotension.  -Again, this is likely related to Parkinson's.  The discontinuation of terazosin and f Mirapex seems to have helped.   4.  Depression  -back on effexor  -Move effexor to AM dosing; may be interfering with sleep  -Decrease klonopin to 0.5 mg - 1/4 tab at night to help with sleep Face to face time:  90 min, greater than 50% in counseling and coordinating care.

## 2013-08-30 NOTE — Telephone Encounter (Signed)
Pt's daughter called wanting to speak to a nurse regarding father's office notes.

## 2013-08-31 ENCOUNTER — Other Ambulatory Visit: Payer: Self-pay | Admitting: Neurology

## 2013-08-31 ENCOUNTER — Encounter: Payer: Self-pay | Admitting: Podiatry

## 2013-08-31 ENCOUNTER — Ambulatory Visit (INDEPENDENT_AMBULATORY_CARE_PROVIDER_SITE_OTHER): Payer: Medicare Other | Admitting: Podiatry

## 2013-08-31 VITALS — BP 97/64 | HR 110 | Resp 18

## 2013-08-31 DIAGNOSIS — M79609 Pain in unspecified limb: Secondary | ICD-10-CM

## 2013-08-31 DIAGNOSIS — B351 Tinea unguium: Secondary | ICD-10-CM

## 2013-08-31 MED ORDER — CARBIDOPA-LEVODOPA 25-100 MG PO TABS: 2.0000 | ORAL_TABLET | Freq: Four times a day (QID) | ORAL | Status: AC

## 2013-08-31 NOTE — Progress Notes (Signed)
Just the toenails

## 2013-08-31 NOTE — Progress Notes (Signed)
Subjective:     Patient ID: [name] Rich, male   DOB: [birth year], 80 y.o.   MRN: [id number]  HPI patient presents with nail disease 1-5 both feet with thickness and inability for him to cut do to advanced Parkinson's and other physical issues. States left first MPJ is feeling better postinjection   Review of Systems     Objective:   Physical Exam Neurovascular status unchanged patient well oriented x3 with caregiver. Thick nail bed 1-5 both feet that are painful    Assessment:     Mycotic nail infection with pain 1-5 both feet    Plan:     Debridement of painful nail bed 1-5 both feet with no iatrogenic bleeding noted and reappoint her recheck

## 2013-09-04 ENCOUNTER — Ambulatory Visit: Payer: Medicare Other | Admitting: Neurology

## 2013-09-06 ENCOUNTER — Encounter: Payer: Self-pay | Admitting: Internal Medicine

## 2013-09-06 ENCOUNTER — Ambulatory Visit (INDEPENDENT_AMBULATORY_CARE_PROVIDER_SITE_OTHER): Payer: Medicare Other | Admitting: Internal Medicine

## 2013-09-06 VITALS — BP 118/60 | HR 94 | Temp 98.2°F | Wt 165.0 lb

## 2013-09-06 DIAGNOSIS — M533 Sacrococcygeal disorders, not elsewhere classified: Secondary | ICD-10-CM

## 2013-09-06 NOTE — Progress Notes (Signed)
Subjective:    Patient ID: [name] Rich, male    DOB: 10/13/1928, 80 y.o.   MRN: [id number]  HPI Here with wife and daughter Fleet Contras  Had fall 1AM Was trying to get to the bathroom, walked a few steps and fell directly on tailbone Sort of blacked out---wife went to him and he cleared within seconds  Needed help from security to get up--after nurse assessment Has pain on tailbone now Moderate pain-- and mild at other times  Still walking about the same Shaking is still bad---variable (?worse in AM and if tired)  Has been able to walk with rollator in house Allows him to go to bathroom without assist Cane doesn't allow stability and he needs the rollator with seat since he gets dizzy quickly and needs to sit down He also needs a cammode seat to raise him up on the toilet. Due to his Parkinson's --he is not able to sit all the way down on regular seat--and then independently get up again  Current Outpatient Prescriptions on File Prior to Visit  Medication Sig Dispense Refill  . aspirin 81 MG tablet Take 81 mg by mouth daily.      . carbidopa-levodopa (SINEMET CR) 50-200 MG per tablet Take 1 tablet by mouth at bedtime.  90 tablet  3  . carbidopa-levodopa (SINEMET IR) 25-100 MG per tablet Take 2 tablets by mouth 4 (four) times daily.  240 tablet  5  . cholecalciferol (VITAMIN D) 1000 UNITS tablet Take 1,000 Units by mouth daily.       . entacapone (COMTAN) 200 MG tablet Take 200 mg by mouth 4 (four) times daily.      Marland Kitchen ibuprofen (ADVIL,MOTRIN) 200 MG tablet Take 200-400 mg by mouth 3 (three) times daily as needed.      . lovastatin (MEVACOR) 40 MG tablet Take 0.5 tablets (20 mg total) by mouth at bedtime.  15 tablet  0  . RANEXA 500 MG 12 hr tablet TAKE ONE TABLET TWICE A DAY  60 tablet  11  . venlafaxine (EFFEXOR) 75 MG tablet Take 1 tablet (75 mg total) by mouth daily.  15 tablet  0   No current facility-administered medications on file prior to visit.    Allergies  Allergen  Reactions  . Morphine And Related     Passed out in hospital  . Xylocaine-Epinephrine [Lidocaine-Epinephrine]   . Exelon [Rivastigmine]     Felt "real bad"    Past Medical History  Diagnosis Date  . Hyperlipidemia   . Coronary artery disease 2010    Dr Gwen Pounds  . Anxiety   . Osteoarthritis of foot   . Parkinson's disease     Dr Kennyth Arnold at [facility] - 2000  . BPH (benign prostatic hypertrophy)   . Essential tremor   . Osteoporosis 2012    forearm only    Past Surgical History  Procedure Laterality Date  . Coronary stent placement  3/10  . Cholecystectomy    . Tonsillectomy and adenoidectomy  childhood  . Lumbosacral fusion  1978  . Joint replacement  1999    Left knee TKR  . Carpal tunnel release      left  . Cataract extraction      right    Family History  Problem Relation Age of Onset  . Heart disease Mother   . Stroke Father   . Diabetes Father   . Cancer Brother   . Heart disease Brother   . Hypertension Brother   .  Diabetes Brother     History   Social History  . Marital Status: Married    Spouse Name: N/A    Number of Children: 5  . Years of Education: N/A   Occupational History  . Retired Insurance underwriterschool principal and superintendent    Social History Main Topics  . Smoking status: Never Smoker   . Smokeless tobacco: Never Used  . Alcohol Use: No     Comment: very little  . Drug Use: No  . Sexual Activity: Not on file   Other Topics Concern  . Not on file   Social History Narrative   Daughter in [place]. 4 other children ([place], [place], [place], [place]--moving to [place])   Has living will   Wife is health care POA (then daughter Fleet ContrasRachel)   Would accept resuscitation attempts   Probably would not want tube feeds if cognitively unaware   Review of Systems Able to move bowels today--some constipation but this is better.  Had been off the tamsulosin--- voiding is much slower. He has urgency also. Prefers to stay off the tamsulosin for now.      Objective:   Physical Exam  Constitutional: He appears well-developed.  Musculoskeletal:  Mild coccyx tenderness  Neurological:  Bradykinesia Moderate tremor in L>R hand Able to stand on his own and walk No freezing now No focal leg weakness          Assessment & Plan:

## 2013-09-06 NOTE — Assessment & Plan Note (Signed)
After fall today Fracture unlikely and would not change Rx Pain is mild to moderate Discussed padding for chairs No other intervention

## 2013-09-06 NOTE — Progress Notes (Signed)
Pre visit review using our clinic review tool, if applicable. No additional management support is needed unless otherwise documented below in the visit note. 

## 2013-09-11 ENCOUNTER — Inpatient Hospital Stay: Payer: Self-pay | Admitting: Internal Medicine

## 2013-09-11 LAB — URINALYSIS, COMPLETE
BILIRUBIN, UR: NEGATIVE
BLOOD: NEGATIVE
Bacteria: NONE SEEN
GLUCOSE, UR: NEGATIVE mg/dL (ref 0–75)
KETONE: NEGATIVE
LEUKOCYTE ESTERASE: NEGATIVE
Nitrite: NEGATIVE
PROTEIN: NEGATIVE
Ph: 7 (ref 4.5–8.0)
RBC,UR: 1 /HPF (ref 0–5)
SPECIFIC GRAVITY: 1.006 (ref 1.003–1.030)
Squamous Epithelial: NONE SEEN
WBC UR: NONE SEEN /HPF (ref 0–5)

## 2013-09-11 LAB — DIFFERENTIAL
BASOS ABS: 0.1 10*3/uL (ref 0.0–0.1)
Basophil %: 0.9 %
EOS PCT: 0.5 %
Eosinophil #: 0 10*3/uL (ref 0.0–0.7)
Lymphocyte #: 1.3 10*3/uL (ref 1.0–3.6)
Lymphocyte %: 19.4 %
Monocyte #: 0.6 x10 3/mm (ref 0.2–1.0)
Monocyte %: 9.2 %
NEUTROS ABS: 4.5 10*3/uL (ref 1.4–6.5)
NEUTROS PCT: 70 %

## 2013-09-11 LAB — TSH: Thyroid Stimulating Horm: 2.22 u[IU]/mL

## 2013-09-11 LAB — COMPREHENSIVE METABOLIC PANEL
ALBUMIN: 3.7 g/dL (ref 3.4–5.0)
ALT: 10 U/L — AB (ref 12–78)
ANION GAP: 3 — AB (ref 7–16)
AST: 28 U/L (ref 15–37)
Alkaline Phosphatase: 53 U/L
BILIRUBIN TOTAL: 0.7 mg/dL (ref 0.2–1.0)
BUN: 22 mg/dL — AB (ref 7–18)
Calcium, Total: 8.7 mg/dL (ref 8.5–10.1)
Chloride: 103 mmol/L (ref 98–107)
Co2: 31 mmol/L (ref 21–32)
Creatinine: 1.09 mg/dL (ref 0.60–1.30)
Glucose: 107 mg/dL — ABNORMAL HIGH (ref 65–99)
Osmolality: 278 (ref 275–301)
Potassium: 4.1 mmol/L (ref 3.5–5.1)
Sodium: 137 mmol/L (ref 136–145)
Total Protein: 7.1 g/dL (ref 6.4–8.2)

## 2013-09-11 LAB — CBC
HCT: 40.7 % (ref 40.0–52.0)
HGB: 13.9 g/dL (ref 13.0–18.0)
MCH: 31.9 pg (ref 26.0–34.0)
MCHC: 34.1 g/dL (ref 32.0–36.0)
MCV: 94 fL (ref 80–100)
Platelet: 218 10*3/uL (ref 150–440)
RBC: 4.35 10*6/uL — ABNORMAL LOW (ref 4.40–5.90)
RDW: 15.1 % — ABNORMAL HIGH (ref 11.5–14.5)
WBC: 6.2 10*3/uL (ref 3.8–10.6)

## 2013-09-11 LAB — TROPONIN I

## 2013-09-11 LAB — RAPID INFLUENZA A&B ANTIGENS

## 2013-09-11 LAB — HEMOGLOBIN A1C: Hemoglobin A1C: 5.6 % (ref 4.2–6.3)

## 2013-09-12 ENCOUNTER — Telehealth: Payer: Self-pay | Admitting: Neurology

## 2013-09-12 ENCOUNTER — Telehealth: Payer: Self-pay

## 2013-09-12 LAB — COMPREHENSIVE METABOLIC PANEL
ALK PHOS: 46 U/L
Albumin: 3.1 g/dL — ABNORMAL LOW (ref 3.4–5.0)
Anion Gap: 5 — ABNORMAL LOW (ref 7–16)
BILIRUBIN TOTAL: 0.5 mg/dL (ref 0.2–1.0)
BUN: 15 mg/dL (ref 7–18)
CALCIUM: 8.3 mg/dL — AB (ref 8.5–10.1)
Chloride: 107 mmol/L (ref 98–107)
Co2: 27 mmol/L (ref 21–32)
Creatinine: 1.15 mg/dL (ref 0.60–1.30)
EGFR (African American): >60
EGFR (Non-African Amer.): 59 — ABNORMAL LOW
GLUCOSE: 95 mg/dL (ref 65–99)
Osmolality: 278 (ref 275–301)
POTASSIUM: 3.9 mmol/L (ref 3.5–5.1)
SGOT(AST): 8 U/L — ABNORMAL LOW (ref 15–37)
SGPT (ALT): <6 U/L — ABNORMAL LOW (ref 12–78)
Sodium: 139 mmol/L (ref 136–145)
Total Protein: 6 g/dL — ABNORMAL LOW (ref 6.4–8.2)

## 2013-09-12 LAB — CBC WITH DIFFERENTIAL/PLATELET
Basophil #: 0.1 10*3/uL (ref 0.0–0.1)
Basophil %: 1.3 %
Eosinophil #: 0 10*3/uL (ref 0.0–0.7)
Eosinophil %: 0.8 %
HCT: 37.3 % — ABNORMAL LOW (ref 40.0–52.0)
HGB: 12.9 g/dL — ABNORMAL LOW (ref 13.0–18.0)
LYMPHS ABS: 1.1 10*3/uL (ref 1.0–3.6)
LYMPHS PCT: 22.8 %
MCH: 32.1 pg (ref 26.0–34.0)
MCHC: 34.6 g/dL (ref 32.0–36.0)
MCV: 93 fL (ref 80–100)
Monocyte #: 0.6 x10 3/mm (ref 0.2–1.0)
Monocyte %: 11.7 %
Neutrophil #: 3 10*3/uL (ref 1.4–6.5)
Neutrophil %: 63.4 %
Platelet: 206 10*3/uL (ref 150–440)
RBC: 4.01 10*6/uL — ABNORMAL LOW (ref 4.40–5.90)
RDW: 15.3 % — ABNORMAL HIGH (ref 11.5–14.5)
WBC: 4.7 10*3/uL (ref 3.8–10.6)

## 2013-09-12 NOTE — Telephone Encounter (Signed)
Spoke to he and wife Doing some better Discussed meds---she wants to switch off all the long acting sinemet since it is inconsistent  May have pneumonia though initial CXR unclear and not good study Will be going to [facility] for rehab---I will see him there

## 2013-09-12 NOTE — Telephone Encounter (Signed)
Lurena Joiner pts daughter left v/m; pt was admitted to room 108 @ [facility] on 09/11/13 due to loss consciousness ? pneumonia; Lurena Joiner wanted to know if Dr Alphonsus Sias would come to see pt at [facility] and had questions about pts parkinson med; unable to reach [place] by phone but spoke with Mrs Aborn; pt is conscious today and feeling some better.Mrs Herlocker wanted to know about switching times on taking Sinemet CR that Dr Tat prescribed for pt; advised to contact Dr Tat about Sinemet. Explained to Mrs Kuperus that the hospitalist will be taking care of pt while in hospital; Mrs Charlebois voiced understanding.

## 2013-09-12 NOTE — Telephone Encounter (Signed)
Pt's daughter called and stated that her father is at the hospital in [place] and has questions regarding his meds. Please call her back.

## 2013-09-12 NOTE — Telephone Encounter (Signed)
Patient's daughter calling - her father lost consciousness yesterday and was taken by ambulance to the hospital in [place]. He is doing better now - on IV fluids and antibiotics. She state he is not doing well on the Carbidopa Levodopa ER at night. He is really shaky in the morning, feeling really bad and weak. They are wondering if they can continue to IR dose through the night? Please advise.

## 2013-09-12 NOTE — Telephone Encounter (Signed)
That is fine with me.  I had gotten a note that the pt refused therapy services at [facility] and I think that they should rethink that decision.

## 2013-09-12 NOTE — Telephone Encounter (Signed)
Spoke with patient's daughter. She is unsure of why they refused PT because they are actually working on getting him in Rehab to have PT before he goes home. She will speak with her father/mother about this. I relayed the message that it is okay to discharge to ER Carbidopa Levodopa and give Carbidopa Levodopa IR through the night as needed - not to exceed #14 tablets in 24 hours. She expressed appreciation and will call if they have any more problems/questions.

## 2013-09-13 LAB — HEMOGLOBIN: HGB: 12.8 g/dL — ABNORMAL LOW (ref 13.0–18.0)

## 2013-09-14 ENCOUNTER — Ambulatory Visit: Payer: Medicare Other | Admitting: Internal Medicine

## 2013-09-16 LAB — CULTURE, BLOOD (SINGLE)

## 2013-09-17 ENCOUNTER — Telehealth: Payer: Self-pay | Admitting: Internal Medicine

## 2013-09-17 NOTE — Telephone Encounter (Signed)
Form on your desk  

## 2013-09-17 NOTE — Telephone Encounter (Signed)
He is in [facility] rehab now I will bring the form with me for his appt tomorrow afternoon

## 2013-09-17 NOTE — Telephone Encounter (Signed)
Pt's daughter dropped of [employer] forms to be completed by Dr. Alphonsus Sias. Gave forms to [place].

## 2013-09-18 DIAGNOSIS — R55 Syncope and collapse: Secondary | ICD-10-CM

## 2013-09-18 DIAGNOSIS — I2510 Atherosclerotic heart disease of native coronary artery without angina pectoris: Secondary | ICD-10-CM

## 2013-09-18 DIAGNOSIS — G20 Parkinson's disease: Secondary | ICD-10-CM

## 2013-09-18 DIAGNOSIS — N400 Enlarged prostate without lower urinary tract symptoms: Secondary | ICD-10-CM

## 2013-10-01 ENCOUNTER — Ambulatory Visit: Payer: Medicare Other | Admitting: Neurology

## 2013-10-03 ENCOUNTER — Telehealth: Payer: Self-pay | Admitting: *Deleted

## 2013-10-03 MED ORDER — FINASTERIDE 5 MG PO TABS: 5.0000 mg | ORAL_TABLET | Freq: Every day | ORAL | Status: DC

## 2013-10-03 NOTE — Telephone Encounter (Signed)
Pt wife is calling asking if pt should continue finasteride? Pt just came home from [facility] and she doesn't know when this was started? Pt was on flomax, please advise?

## 2013-10-03 NOTE — Telephone Encounter (Signed)
Spoke with patient's wife and advised results rx sent to pharmacy by e-script  

## 2013-10-03 NOTE — Telephone Encounter (Signed)
Yes he should I am hoping that will help him void and that we can consider stopping the flomax (which could cause some dizziness )

## 2013-10-31 ENCOUNTER — Telehealth: Payer: Self-pay

## 2013-10-31 NOTE — Telephone Encounter (Signed)
[name] Rich left v/m requesting written rx for finasteride for [facility]. Pt is presently getting at local pharmacy but rx would be less expensive to get at [facility]. [name] Rich request written rx mailed to her verified home address. Pt is not out of medication.

## 2013-10-31 NOTE — Telephone Encounter (Signed)
Okay to do written Rx for a year Please put in someone else's name so they can sign it and get it out to her

## 2013-11-01 MED ORDER — FINASTERIDE 5 MG PO TABS: 5.0000 mg | ORAL_TABLET | Freq: Every day | ORAL | Status: AC

## 2013-11-01 NOTE — Telephone Encounter (Signed)
Spoke with wife and advised rx will be mailed to her home address.

## 2013-11-02 ENCOUNTER — Encounter: Payer: Self-pay | Admitting: Family Medicine

## 2013-11-02 ENCOUNTER — Ambulatory Visit (INDEPENDENT_AMBULATORY_CARE_PROVIDER_SITE_OTHER): Payer: Medicare Other | Admitting: Family Medicine

## 2013-11-02 VITALS — BP 110/70 | HR 69 | Temp 98.1°F | Ht 66.0 in | Wt 160.5 lb

## 2013-11-02 DIAGNOSIS — R1031 Right lower quadrant pain: Secondary | ICD-10-CM

## 2013-11-02 MED ORDER — MELOXICAM 7.5 MG PO TABS: 7.5000 mg | ORAL_TABLET | Freq: Every day | ORAL | Status: DC

## 2013-11-02 NOTE — Progress Notes (Signed)
Pre visit review using our clinic review tool, if applicable. No additional management support is needed unless otherwise documented below in the visit note. 

## 2013-11-02 NOTE — Assessment & Plan Note (Signed)
Likely MSK strain from recent exercises, but given some element of RLQ abdominal pain... If not improving with NSAIDs low dose, stretches ( info given), heat then consider further eval.

## 2013-11-02 NOTE — Progress Notes (Signed)
   Subjective:    Patient ID: [name] Rich, male    DOB: [birth year], 80 y.o.   MRN: [id number]  HPI  80 year old male pt  with parkinson's and dementia of Dr. Alphonsus SiasLetvak with new onset right groin pain, intermittent in last 2 days. No recent fall or injury. Worse with walking. Using advil for pain helps some. He is getting OT and PT... He does exercise that lifts his legs, new in last 3-4 days. ? Related to new pain.   No new weakness or numbness  No change low back pain.       Review of Systems  Constitutional: Positive for fatigue. Negative for fever.  HENT: Negative for ear pain.   Eyes: Negative for pain.  Respiratory: Negative for shortness of breath.   Cardiovascular: Negative for chest pain.  Gastrointestinal: Positive for constipation (last week, since taking miralax, regular BMs). Negative for abdominal pain.       Objective:   Physical Exam  Constitutional: Vital signs are normal. He appears well-developed and well-nourished.  E;lderly male with slowed speech  HENT:  Head: Normocephalic.  Right Ear: Hearing normal.  Left Ear: Hearing normal.  Nose: Nose normal.  Mouth/Throat: Oropharynx is clear and moist and mucous membranes are normal.  Neck: Trachea normal. Carotid bruit is not present. No mass and no thyromegaly present.  Cardiovascular: Normal rate, regular rhythm and normal pulses.  Exam reveals no gallop, no distant heart sounds and no friction rub.   No murmur heard. No peripheral edema  Pulmonary/Chest: Effort normal and breath sounds normal. No respiratory distress.  Abdominal: There is no hepatosplenomegaly. There is tenderness in the right lower quadrant. There is no CVA tenderness.  Musculoskeletal:       Right hip: He exhibits tenderness. He exhibits normal range of motion, normal strength and no bony tenderness.       Legs: ttp in low abdomen as well as in groin  Skin: Skin is warm, dry and intact. No rash noted.  Psychiatric: He has a normal mood  and affect. His speech is normal and behavior is normal. Thought content normal.          Assessment & Plan:

## 2013-11-02 NOTE — Patient Instructions (Addendum)
Stop ibuprefoen.  Start meloxicam daily for pain and inflammation.  heat of groin area.  Hold leg PT for now unless gentle stretching.  Home gentle stretching. Follow up next week with PCP.

## 2013-11-08 ENCOUNTER — Ambulatory Visit (INDEPENDENT_AMBULATORY_CARE_PROVIDER_SITE_OTHER): Payer: Medicare Other | Admitting: Internal Medicine

## 2013-11-08 ENCOUNTER — Encounter: Payer: Self-pay | Admitting: Internal Medicine

## 2013-11-08 VITALS — BP 130/60 | HR 70 | Temp 97.7°F | Wt 161.0 lb

## 2013-11-08 DIAGNOSIS — G20A1 Parkinson's disease without dyskinesia, without mention of fluctuations: Secondary | ICD-10-CM

## 2013-11-08 DIAGNOSIS — F0280 Dementia in other diseases classified elsewhere without behavioral disturbance: Secondary | ICD-10-CM

## 2013-11-08 DIAGNOSIS — R1031 Right lower quadrant pain: Secondary | ICD-10-CM

## 2013-11-08 DIAGNOSIS — G20 Parkinson's disease: Secondary | ICD-10-CM

## 2013-11-08 DIAGNOSIS — I2510 Atherosclerotic heart disease of native coronary artery without angina pectoris: Secondary | ICD-10-CM

## 2013-11-08 DIAGNOSIS — N400 Enlarged prostate without lower urinary tract symptoms: Secondary | ICD-10-CM

## 2013-11-08 MED ORDER — DOXYCYCLINE MONOHYDRATE 100 MG PO TABS: 100.0000 mg | ORAL_TABLET | Freq: Two times a day (BID) | ORAL | Status: DC

## 2013-11-08 NOTE — Progress Notes (Signed)
Pre visit review using our clinic review tool, if applicable. No additional management support is needed unless otherwise documented below in the visit note. 

## 2013-11-08 NOTE — Assessment & Plan Note (Signed)
Has been quiet Due for labs

## 2013-11-08 NOTE — Progress Notes (Signed)
Subjective:    Patient ID: [name] Rich, male    DOB: May 10, 1929, 80 y.o.   MRN: [id number]  HPI Here with wife Still having right groin pain Better for a couple of days--then returned on left side Now back on right  Notices it with walking or twisting No relationship to eating Bowels have been fine Seems worse at night Still slow urine stream and small amounts No hematuria  [facility] Uses bathroom independently Needs help with bath Notes a lot of freezing Cognition okay in AM---then increasing confusion as the day goes on  No chest pain No SOB  Current Outpatient Prescriptions on File Prior to Visit  Medication Sig Dispense Refill  . aspirin 81 MG tablet Take 81 mg by mouth daily.      . carbidopa-levodopa (SINEMET IR) 25-100 MG per tablet Take 2 tablets by mouth 4 (four) times daily.  240 tablet  5  . cholecalciferol (VITAMIN D) 1000 UNITS tablet Take 1,000 Units by mouth daily.       . entacapone (COMTAN) 200 MG tablet Take 200 mg by mouth 4 (four) times daily.      . finasteride (PROSCAR) 5 MG tablet Take 1 tablet (5 mg total) by mouth daily.  90 tablet  3  . lovastatin (MEVACOR) 40 MG tablet Take 0.5 tablets (20 mg total) by mouth at bedtime.  15 tablet  0  . meloxicam (MOBIC) 7.5 MG tablet Take 1 tablet (7.5 mg total) by mouth daily.  30 tablet  0  . RANEXA 500 MG 12 hr tablet TAKE ONE TABLET TWICE A DAY  60 tablet  11  . sennosides-docusate sodium (SENOKOT-S) 8.6-50 MG tablet Take 1 tablet by mouth daily.      . tamsulosin (FLOMAX) 0.4 MG CAPS capsule Take 0.4 mg by mouth daily. If trouble voiding      . venlafaxine (EFFEXOR) 75 MG tablet Take 1 tablet (75 mg total) by mouth daily.  15 tablet  0   No current facility-administered medications on file prior to visit.    Allergies  Allergen Reactions  . Morphine And Related     Passed out in hospital  . Xylocaine-Epinephrine [Lidocaine-Epinephrine]   .  Exelon [Rivastigmine]     Felt "real bad"    Past Medical History  Diagnosis Date  . Hyperlipidemia   . Coronary artery disease 2010    Dr Gwen PoundsKowalski  . Anxiety   . Osteoarthritis of foot   . Parkinson's disease     [facility]Stacy [facility]  . BPH (benign prostatic hypertrophy)   . Essential tremor   . Osteoporosis 2012    forearm only    Past Surgical History  Procedure Laterality Date  . Coronary stent placement  3/10  . Cholecystectomy    . Tonsillectomy and adenoidectomy  childhood  . Lumbosacral fusion  1978  . Joint replacement  1999    Left knee TKR  . Carpal tunnel release      left  . Cataract extraction      right    Family History  Problem Relation Age of Onset  . Heart disease Mother   . Stroke Father   . Diabetes Father   . Cancer Brother   . Heart disease Brother   . Hypertension Brother   . Diabetes Brother     History   Social History  . Marital Status: Married    Spouse  Name: N/A    Number of Children: 5  . Years of Education: N/A   Occupational History  . Retired Insurance underwriter    Social History Main Topics  . Smoking status: Never Smoker   . Smokeless tobacco: Never Used  . Alcohol Use: No     Comment: very little  . Drug Use: No  . Sexual Activity: Not on file   Other Topics Concern  . Not on file   Social History Narrative   Daughter in [place]. 4 other children ([place], [place], [place], [place] to [place])   Has living will   Wife is health care POA (then daughter Fleet Contras)   Would accept resuscitation attempts   Probably would not want tube feeds if cognitively unaware   Review of Systems Was hospitalized for sycnope--?pneumonia in February. 2 weeks in rehab then Sleeps okay---up to void multiple times but gets back to sleep--has aide from to 8AM so wife can sleep Appetite is fair--- doesn't eat much at a time Weight is stable    Objective:   Physical Exam  Constitutional:  He appears well-developed and well-nourished. No distress.  Neck: Normal range of motion.  Cardiovascular: Normal rate and regular rhythm.   Soft systolic murmur  Pulmonary/Chest: Effort normal and breath sounds normal. No respiratory distress. He has no wheezes. He has no rales.  Abdominal: Soft. There is no tenderness.  Genitourinary:  Thickening and tenderness along right spermatic cord and slightly on left No scrotal swelling or tenderness ?hernia--not clear cut  Lymphadenopathy:    He has no cervical adenopathy.  Neurological:  Resting right hand tremor Tone fairly normal Bradykinesia and slow gait  Psychiatric: He has a normal mood and affect. His behavior is normal.          Assessment & Plan:

## 2013-11-08 NOTE — Assessment & Plan Note (Signed)
Mild progression of care needs Continues with Dr Arbutus Leas

## 2013-11-08 NOTE — Assessment & Plan Note (Signed)
Slow voiding but stable

## 2013-11-08 NOTE — Assessment & Plan Note (Signed)
No major change Cognitive issues are mostly when he is tired

## 2013-11-08 NOTE — Assessment & Plan Note (Signed)
Has tenderness in spermatic cord without clear hernia ?prostate/epididymis related Will try doxy course just in case low grade infection

## 2013-11-09 LAB — COMPREHENSIVE METABOLIC PANEL
ALT: 8 U/L (ref 0–53)
AST: 17 U/L (ref 0–37)
Albumin: 4.5 g/dL (ref 3.5–5.2)
Alkaline Phosphatase: 56 U/L (ref 39–117)
BUN: 21 mg/dL (ref 6–23)
CO2: 27 meq/L (ref 19–32)
Calcium: 9.8 mg/dL (ref 8.4–10.5)
Chloride: 99 mEq/L (ref 96–112)
Creatinine, Ser: 1.2 mg/dL (ref 0.4–1.5)
GFR: 63.98 mL/min (ref >60.00)
Glucose, Bld: 75 mg/dL (ref 70–99)
POTASSIUM: 4.2 meq/L (ref 3.5–5.1)
Sodium: 135 mEq/L (ref 135–145)
Total Bilirubin: 0.7 mg/dL (ref 0.3–1.2)
Total Protein: 7.3 g/dL (ref 6.0–8.3)

## 2013-11-09 LAB — CBC WITH DIFFERENTIAL/PLATELET
BASOS PCT: 0.5 % (ref 0.0–3.0)
Basophils Absolute: 0 10*3/uL (ref 0.0–0.1)
EOS ABS: 0 10*3/uL (ref 0.0–0.7)
Eosinophils Relative: 0.6 % (ref 0.0–5.0)
HCT: 37.8 % — ABNORMAL LOW (ref 39.0–52.0)
Hemoglobin: 12.8 g/dL — ABNORMAL LOW (ref 13.0–17.0)
LYMPHS PCT: 17 % (ref 12.0–46.0)
Lymphs Abs: 1.3 10*3/uL (ref 0.7–4.0)
MCHC: 33.8 g/dL (ref 30.0–36.0)
MCV: 94.1 fl (ref 78.0–100.0)
Monocytes Absolute: 0.5 10*3/uL (ref 0.1–1.0)
Monocytes Relative: 6.8 % (ref 3.0–12.0)
Neutro Abs: 5.7 10*3/uL (ref 1.4–7.7)
Neutrophils Relative %: 75.1 % (ref 43.0–77.0)
Platelets: 311 10*3/uL (ref 150.0–400.0)
RBC: 4.02 Mil/uL — AB (ref 4.22–5.81)
RDW: 14.1 % (ref 11.5–14.6)
WBC: 7.6 10*3/uL (ref 4.5–10.5)

## 2013-11-09 LAB — LIPID PANEL
CHOLESTEROL: 151 mg/dL (ref 0–200)
HDL: 48.8 mg/dL (ref >39.00)
LDL CALC: 78 mg/dL (ref 0–99)
TRIGLYCERIDES: 122 mg/dL (ref 0.0–149.0)
Total CHOL/HDL Ratio: 3
VLDL: 24.4 mg/dL (ref 0.0–40.0)

## 2013-11-09 LAB — TSH: TSH: 3.71 u[IU]/mL (ref 0.35–5.50)

## 2013-11-09 LAB — T4, FREE: FREE T4: 0.83 ng/dL (ref 0.60–1.60)

## 2013-11-13 ENCOUNTER — Encounter: Payer: Self-pay | Admitting: Family Medicine

## 2013-11-15 ENCOUNTER — Telehealth: Payer: Self-pay

## 2013-11-15 ENCOUNTER — Telehealth: Payer: Self-pay | Admitting: Internal Medicine

## 2013-11-15 ENCOUNTER — Emergency Department: Payer: Self-pay | Admitting: Emergency Medicine

## 2013-11-15 LAB — COMPREHENSIVE METABOLIC PANEL
ALBUMIN: 3.5 g/dL (ref 3.4–5.0)
ALK PHOS: 68 U/L
AST: 10 U/L — AB (ref 15–37)
Anion Gap: 6 — ABNORMAL LOW (ref 7–16)
BILIRUBIN TOTAL: 0.6 mg/dL (ref 0.2–1.0)
BUN: 24 mg/dL — ABNORMAL HIGH (ref 7–18)
CREATININE: 1.05 mg/dL (ref 0.60–1.30)
Calcium, Total: 9.1 mg/dL (ref 8.5–10.1)
Chloride: 102 mmol/L (ref 98–107)
Co2: 30 mmol/L (ref 21–32)
EGFR (Non-African Amer.): >60
Glucose: 102 mg/dL — ABNORMAL HIGH (ref 65–99)
Osmolality: 280 (ref 275–301)
POTASSIUM: 3.8 mmol/L (ref 3.5–5.1)
Sodium: 138 mmol/L (ref 136–145)
TOTAL PROTEIN: 6.9 g/dL (ref 6.4–8.2)

## 2013-11-15 LAB — CBC
HCT: 33.3 % — ABNORMAL LOW (ref 40.0–52.0)
HGB: 11 g/dL — ABNORMAL LOW (ref 13.0–18.0)
MCH: 31.5 pg (ref 26.0–34.0)
MCHC: 33.1 g/dL (ref 32.0–36.0)
MCV: 95 fL (ref 80–100)
PLATELETS: 220 10*3/uL (ref 150–440)
RBC: 3.51 10*6/uL — ABNORMAL LOW (ref 4.40–5.90)
RDW: 14 % (ref 11.5–14.5)
WBC: 5.7 10*3/uL (ref 3.8–10.6)

## 2013-11-15 LAB — URINALYSIS, COMPLETE
BACTERIA: NONE SEEN
BILIRUBIN, UR: NEGATIVE
Glucose,UR: NEGATIVE mg/dL (ref 0–75)
Leukocyte Esterase: NEGATIVE
Nitrite: NEGATIVE
PROTEIN: NEGATIVE
Ph: 5 (ref 4.5–8.0)
RBC,UR: 3 /HPF (ref 0–5)
SPECIFIC GRAVITY: 1.015 (ref 1.003–1.030)

## 2013-11-15 LAB — LIPASE, BLOOD: Lipase: 261 U/L (ref 73–393)

## 2013-11-15 NOTE — Telephone Encounter (Signed)
Patient Information:  Caller Name: [name] [name]  Phone: [phone]  Patient: [name] [name]  Gender: Male  DOB: 05/12/1929  Age: 80 Years  PCP: [name] [facility])  Office Follow Up:  Does the office need to follow up with this patient?: No  Instructions For The Office: States plan to go to [facility]   Symptoms  Reason For Call & Symptoms: Spouse reports abdominal pain in lower right abdomen.  There is visible bulge; he screams when she touches it. Pain rated at 9 of 10; he is almost in tears.  Emergent symptoms ruled out. Go to ED Now due to Severe abdominal pain.  Confirmed ED disposition with Dee at office.  Reviewed Health History In EMR: Yes  Reviewed Medications In EMR: Yes  Reviewed Allergies In EMR: Yes  Reviewed Surgeries / Procedures: Yes  Date of Onset of Symptoms: 11/02/2013  Treatments Tried: Rx pain meds  Treatments Tried Worked: No  Guideline(s) Used:  Abdominal Pain - Male  Disposition Per Guideline:   Go to ED Now  Reason For Disposition Reached:   Severe abdominal pain (e.g., excruciating)  Advice Given:  N/A  Patient Will Follow Care Advice:  YES

## 2013-11-15 NOTE — Telephone Encounter (Signed)
pts daughter,[name] left v/m pt was just d/c from ED and pt was given hydrocodone for pain, Fleet Contras request cb to [name] to make sure hydrocodone is not on pts allergy list. I called Nadine and spoke with her and Fleet Contras; Fleet Contras is concerned that Hydrocodone may make pt "loopy" and wants Dr Karle Starch opinion of if pt should take med or not. Request cb.

## 2013-11-15 NOTE — Telephone Encounter (Signed)
Discussed with the physician there Groin is quiet now Concerned about the abdomen Labs okay Awaiting the CT scan---he will let me know what happens

## 2013-11-15 NOTE — Telephone Encounter (Signed)
Sent home from ER CT didn't show anything distinct Got Rx for hydrocodone--okay to try this if the pain is bad (better now) Can try 1/2 first If pain continues, I can recheck him tomorrow--may want to make referral for GI

## 2013-11-19 ENCOUNTER — Telehealth: Payer: Self-pay | Admitting: Internal Medicine

## 2013-11-19 NOTE — Telephone Encounter (Signed)
Yes, finish out the antibiotic

## 2013-11-19 NOTE — Telephone Encounter (Signed)
Reviewed the CT from the ER Asked her to stop the meloxicam (she had stopped this) Please start omeprazole 20mg  daily on empty stomach.  Had trouble this weekend with pain---but lower down (like nearer to groin) Wife giving miralax once a day--- goes but may not be clearing Asked to go to bid and consider enema or dulcolax suppository to be sure he isn't obstipated also

## 2013-11-19 NOTE — Telephone Encounter (Signed)
Pt's wife called and wanted to know whether to continue the antibiotic or not.  Please advise.  Best number to call spouse [name] Rich is [phone]

## 2013-11-19 NOTE — Telephone Encounter (Signed)
Spoke with patient's wife and advised results  

## 2013-11-23 ENCOUNTER — Ambulatory Visit: Payer: Medicare Other | Admitting: Podiatry

## 2013-11-26 ENCOUNTER — Telehealth: Payer: Self-pay

## 2013-11-26 ENCOUNTER — Encounter: Payer: Self-pay | Admitting: Internal Medicine

## 2013-11-26 ENCOUNTER — Ambulatory Visit (INDEPENDENT_AMBULATORY_CARE_PROVIDER_SITE_OTHER): Payer: Medicare Other | Admitting: Internal Medicine

## 2013-11-26 VITALS — BP 110/60 | HR 91 | Temp 97.3°F | Wt 157.0 lb

## 2013-11-26 DIAGNOSIS — G20 Parkinson's disease: Secondary | ICD-10-CM

## 2013-11-26 DIAGNOSIS — F0280 Dementia in other diseases classified elsewhere without behavioral disturbance: Secondary | ICD-10-CM

## 2013-11-26 DIAGNOSIS — K4030 Unilateral inguinal hernia, with obstruction, without gangrene, not specified as recurrent: Secondary | ICD-10-CM

## 2013-11-26 NOTE — Telephone Encounter (Signed)
Please add him on at 4:45PM I may need to have him see a Careers adviser

## 2013-11-26 NOTE — Telephone Encounter (Signed)
Spoke with patient's wife and advised results, they will be here at 4:45

## 2013-11-26 NOTE — Assessment & Plan Note (Addendum)
Wasn't evident on my past exam Seems incarcerated but not strangulated CT of abdomen done at [facility]--not yet scanned. Had ?duodenitis---but this is separate issue (is on omeprazole now) May still be reducing when in bed--but not consistently  Will set up with surgeon Probably needs surgery soon (though not urgently)

## 2013-11-26 NOTE — Assessment & Plan Note (Signed)
Now with mild psychotic symptoms Will hold off on meds unless he worsens Will be discussing with Dr Tat as well

## 2013-11-26 NOTE — Telephone Encounter (Signed)
Mrs Tenny CrawRoss said pt's abd and groin pain was better but this AM pain has intensified to 8-9. Bulge in pts groin is also larger. Mrs Tenny CrawRoss [facility] cb ASAP[facility].

## 2013-11-26 NOTE — Progress Notes (Signed)
Subjective:    Patient ID: [name] Rich, male    DOB: 1929/06/30, 80 y.o.   MRN: [id number]  HPI Here with wife  Has a lot of pain in the morning---in right groin Occasionally feels it in the left Seems to ease up during the day  Slow urinary stream-- no change 1 day left of the doxycycline--no clear help No fever  Did start the prilosec daily now No clear help with this either This may have helped the appetite though  Having some problems with "reality" Has different idea of what is happening than wife or caregiver No recent hallucinations Some anger  Current Outpatient Prescriptions on File Prior to Visit  Medication Sig Dispense Refill  . aspirin 81 MG tablet Take 81 mg by mouth daily.      . carbidopa-levodopa (SINEMET IR) 25-100 MG per tablet Take 2 tablets by mouth 4 (four) times daily.  240 tablet  5  . cholecalciferol (VITAMIN D) 1000 UNITS tablet Take 2,000 Units by mouth daily.       Marland Kitchen doxycycline (ADOXA) 100 MG tablet Take 1 tablet (100 mg total) by mouth 2 (two) times daily.  20 tablet  1  . entacapone (COMTAN) 200 MG tablet Take 200 mg by mouth 4 (four) times daily.      . finasteride (PROSCAR) 5 MG tablet Take 1 tablet (5 mg total) by mouth daily.  90 tablet  3  . lovastatin (MEVACOR) 40 MG tablet Take 0.5 tablets (20 mg total) by mouth at bedtime.  15 tablet  0  . RANEXA 500 MG 12 hr tablet TAKE ONE TABLET TWICE A DAY  60 tablet  11  . sennosides-docusate sodium (SENOKOT-S) 8.6-50 MG tablet Take 1 tablet by mouth daily.      Marland Kitchen venlafaxine (EFFEXOR) 75 MG tablet Take 1 tablet (75 mg total) by mouth daily.  15 tablet  0   No current facility-administered medications on file prior to visit.    Allergies  Allergen Reactions  . Morphine And Related     Passed out in hospital  . Xylocaine-Epinephrine [Lidocaine-Epinephrine]   . Exelon [Rivastigmine]     Felt "real bad"    Past Medical History  Diagnosis Date  . Hyperlipidemia   . Coronary artery disease  2010    Dr Gwen Pounds  . Anxiety   . Osteoarthritis of foot   . Parkinson's disease     Dr Kennyth Arnold at [facility] - 2000  . BPH (benign prostatic hypertrophy)   . Essential tremor   . Osteoporosis 2012    forearm only    Past Surgical History  Procedure Laterality Date  . Coronary stent placement  3/10  . Cholecystectomy    . Tonsillectomy and adenoidectomy  childhood  . Lumbosacral fusion  1978  . Joint replacement  1999    Left knee TKR  . Carpal tunnel release      left  . Cataract extraction      right    Family History  Problem Relation Age of Onset  . Heart disease Mother   . Stroke Father   . Diabetes Father   . Cancer Brother   . Heart disease Brother   . Hypertension Brother   . Diabetes Brother     History   Social History  . Marital Status: Married    Spouse Name: N/A    Number of Children: 5  . Years of Education: N/A   Occupational History  . Retired school principal and  superintendent    Social History Main Topics  . Smoking status: Never Smoker   . Smokeless tobacco: Never Used  . Alcohol Use: No     Comment: very little  . Drug Use: No  . Sexual Activity: Not on file   Other Topics Concern  . Not on file   Social History Narrative   Daughter in [place]. 4 other children ([place]--moving to [place])   Has living will   Wife is health care POA (then daughter Fleet ContrasRachel)   Would accept resuscitation attempts   Probably would not want tube feeds if cognitively unaware   Review of Systems Bowels better with miralax Appetite is good--though weight is going down (and not eating as much as usual) Tried liquid diet last week---may have helped    Objective:   Physical Exam  Constitutional: He appears well-developed and well-nourished. No distress.  Abdominal:  Mild RMQ and RLQ tenderness Now has clear cut right inguinal hernia which is tender No scrotal findings          Assessment & Plan:

## 2013-11-26 NOTE — Progress Notes (Signed)
Pre visit review using our clinic review tool, if applicable. No additional management support is needed unless otherwise documented below in the visit note. 

## 2013-11-27 ENCOUNTER — Ambulatory Visit: Payer: Medicare Other | Admitting: Neurology

## 2013-11-28 ENCOUNTER — Encounter: Payer: Self-pay | Admitting: General Surgery

## 2013-11-28 ENCOUNTER — Ambulatory Visit (INDEPENDENT_AMBULATORY_CARE_PROVIDER_SITE_OTHER): Payer: Medicare Other | Admitting: General Surgery

## 2013-11-28 VITALS — BP 118/74 | HR 72 | Resp 16 | Ht 66.0 in | Wt 156.0 lb

## 2013-11-28 DIAGNOSIS — K4090 Unilateral inguinal hernia, without obstruction or gangrene, not specified as recurrent: Secondary | ICD-10-CM

## 2013-11-28 DIAGNOSIS — R110 Nausea: Secondary | ICD-10-CM

## 2013-11-28 DIAGNOSIS — G20 Parkinson's disease: Secondary | ICD-10-CM

## 2013-11-28 DIAGNOSIS — I209 Angina pectoris, unspecified: Secondary | ICD-10-CM

## 2013-11-28 NOTE — Patient Instructions (Addendum)
The patient is aware to call back for any questions or concerns. Inguinal Hernia, Adult Muscles help keep everything in the body in its proper place. But if a weak spot in the muscles develops, something can poke through. That is called a hernia. When this happens in the lower part of the belly (abdomen), it is called an inguinal hernia. (It takes its name from a part of the body in this region called the inguinal canal.) A weak spot in the wall of muscles lets some fat or part of the small intestine bulge through. An inguinal hernia can develop at any age. Men get them more often than women. CAUSES  In adults, an inguinal hernia develops over time.  It can be triggered by:  Suddenly straining the muscles of the lower abdomen.  Lifting heavy objects.  Straining to have a bowel movement. Difficult bowel movements (constipation) can lead to this.  Constant coughing. This may be caused by smoking or lung disease.  Being overweight.  Being pregnant.  Working at a job that requires long periods of standing or heavy lifting.  Having had an inguinal hernia before. One type can be an emergency situation. It is called a strangulated inguinal hernia. It develops if part of the small intestine slips through the weak spot and cannot get back into the abdomen. The blood supply can be cut off. If that happens, part of the intestine may die. This situation requires emergency surgery. SYMPTOMS  Often, a small inguinal hernia has no symptoms. It is found when a healthcare provider does a physical exam. Larger hernias usually have symptoms.   In adults, symptoms may include:  A lump in the groin. This is easier to see when the person is standing. It might disappear when lying down.  In men, a lump in the scrotum.  Pain or burning in the groin. This occurs especially when lifting, straining or coughing.  A dull ache or feeling of pressure in the groin.  Signs of a strangulated hernia can  include:  A bulge in the groin that becomes very painful and tender to the touch.  A bulge that turns red or purple.  Fever, nausea and vomiting.  Inability to have a bowel movement or to pass gas. DIAGNOSIS  To decide if you have an inguinal hernia, a healthcare provider will probably do a physical examination.  This will include asking questions about any symptoms you have noticed.  The healthcare provider might feel the groin area and ask you to cough. If an inguinal hernia is felt, the healthcare provider may try to slide it back into the abdomen.  Usually no other tests are needed. TREATMENT  Treatments can vary. The size of the hernia makes a difference. Options include:  Watchful waiting. This is often suggested if the hernia is small and you have had no symptoms.  No medical procedure will be done unless symptoms develop.  You will need to watch closely for symptoms. If any occur, contact your healthcare provider right away.  Surgery. This is used if the hernia is larger or you have symptoms.  Open surgery. This is usually an outpatient procedure (you will not stay overnight in a hospital). An cut (incision) is made through the skin in the groin. The hernia is put back inside the abdomen. The weak area in the muscles is then repaired by herniorrhaphy or hernioplasty. Herniorrhaphy: in this type of surgery, the weak muscles are sewn back together. Hernioplasty: a patch or mesh is   is used to close the weak area in the abdominal wall.  Laparoscopy. In this procedure, a surgeon makes small incisions. A thin tube with a tiny video camera (called a laparoscope) is put into the abdomen. The surgeon repairs the hernia with mesh by looking with the video camera and using two long instruments. HOME CARE INSTRUCTIONS   After surgery to repair an inguinal hernia:  You will need to take pain medicine prescribed by your healthcare provider. Follow all directions carefully.  You will need  to take care of the wound from the incision.  Your activity will be restricted for awhile. This will probably include no heavy lifting for several weeks. You also should not do anything too active for a few weeks. When you can return to work will depend on the type of job that you have.  During "watchful waiting" periods, you should:  Maintain a healthy weight.  Eat a diet high in fiber (fruits, vegetables and whole grains).  Drink plenty of fluids to avoid constipation. This means drinking enough water and other liquids to keep your urine clear or pale yellow.  Do not lift heavy objects.  Do not stand for long periods of time.  Quit smoking. This should keep you from developing a frequent cough. SEEK MEDICAL CARE IF:   A bulge develops in your groin area.  You feel pain, a burning sensation or pressure in the groin. This might be worse if you are lifting or straining.  You develop a fever of more than 100.5 F (38.1 C). SEEK IMMEDIATE MEDICAL CARE IF:   Pain in the groin increases suddenly.  A bulge in the groin gets bigger suddenly and does not go down.  For men, there is sudden pain in the scrotum. Or, the size of the scrotum increases.  A bulge in the groin area becomes red or purple and is painful to touch.  You have nausea or vomiting that does not go away.  You feel your heart beating much faster than normal.  You cannot have a bowel movement or pass gas.  You develop a fever of more than 102.0 F (38.9 C). Document Released: 11/21/2008 Document Revised: 09/27/2011 Document Reviewed: 11/21/2008 [facility] Patient Information 2014 [place], [place].  Patient has been scheduled with Dr Arnoldo Hooker at [facility] for 12/13/13 at 3:45 pm.

## 2013-11-28 NOTE — Progress Notes (Signed)
Patient ID: [name] Rich, male   DOB: [birth year], 80 y.o.   MRN: [id number]  Chief Complaint  Patient presents with  . Hernia    HPI [name] BowieDavid Whitlatch is a 80 y.o. male.  Here today for evaluation of a hernia referred by Dr. Alphonsus SiasLetvak. States one month ago with physical therapy he noticed he had right groin/lower abdominal pain. He since then has been back to the doctor and to the ER and had a CT scan. The pain comes and goes and he says there is a budge in that area "walnut size". States 2 days ago the pain seems to have gotten better.  The patient reports that since beginning Ranexa he has had less frequent angina. In February he was hospitalized for pneumonia for one week at [facility], and then went to rehab for 2 weeks. Wife states he is sleeping more during the day and has urinary frequency at night.  The patient's wife had put him on a clear liquid diet with resolution of his abdominal pain, since advancing it to a soft diet he seemed to have a low more indigestion or upper abdominal distress. No vomiting has been noted. Bowels move daily with the use of Miralax.  [place] live [place] [facility] [place].  His youngest daughter was present for today's exam. She is a Runner, broadcasting/film/videoteacher at in the [employer]. (Sexual education). HPI  Past Medical History  Diagnosis Date  . Hyperlipidemia   . Coronary artery disease 2010    Dr Gwen PoundsKowalski  . Anxiety   . Osteoarthritis of foot   . Parkinson's disease     Dr Kennyth [facility]Stacy at [facility] - 2000  . BPH (benign prostatic hypertrophy)   . Essential tremor   . Osteoporosis 2012    forearm only    Past Surgical History  Procedure Laterality Date  . Coronary stent placement  3/10  . Cholecystectomy    . Tonsillectomy and adenoidectomy  childhood  . Lumbosacral fusion  1978  . Joint replacement  1999    Left knee TKR  . Carpal tunnel release      left  . Cataract extraction      right    Family History  Problem Relation Age of Onset  . Heart disease Mother   . Stroke  Father   . Diabetes Father   . Cancer Brother   . Heart disease Brother   . Hypertension Brother   . Diabetes Brother     Social History History  Substance Use Topics  . Smoking status: Never Smoker   . Smokeless tobacco: Never Used  . Alcohol Use: No     Comment: very little    Allergies  Allergen Reactions  . Morphine And Related     Passed out in hospital  . Xylocaine-Epinephrine [Lidocaine-Epinephrine] Other (See Comments)    faint  . Exelon [Rivastigmine]     Felt "real bad"    Current Outpatient Prescriptions  Medication Sig Dispense Refill  . aspirin 81 MG tablet Take 81 mg by mouth daily.      . carbidopa-levodopa (SINEMET IR) 25-100 MG per tablet Take 2 tablets by mouth 4 (four) times daily.  240 tablet  5  . cholecalciferol (VITAMIN D) 1000 UNITS tablet Take 2,000 Units by mouth daily.       Marland Kitchen. doxycycline (ADOXA) 100 MG tablet Take 1 tablet (100 mg total) by mouth 2 (two) times daily.  20 tablet  1  . entacapone (COMTAN) 200 MG tablet Take 200  mg by mouth 4 (four) times daily.      . finasteride (PROSCAR) 5 MG tablet Take 1 tablet (5 mg total) by mouth daily.  90 tablet  3  . lovastatin (MEVACOR) 40 MG tablet Take 0.5 tablets (20 mg total) by mouth at bedtime.  15 tablet  0  . Polyethylene Glycol 3350 (MIRALAX PO) Take by mouth daily as needed.      Marland Kitchen RANEXA 500 MG 12 hr tablet TAKE ONE TABLET TWICE A DAY  60 tablet  11  . sennosides-docusate sodium (SENOKOT-S) 8.6-50 MG tablet Take 1 tablet by mouth daily.      Marland Kitchen venlafaxine (EFFEXOR) 75 MG tablet Take 1 tablet (75 mg total) by mouth daily.  15 tablet  0   No current facility-administered medications for this visit.    Review of Systems Review of Systems  Constitutional: Negative.   Respiratory: Negative.   Cardiovascular: Negative.     Blood pressure 118/74, pulse 72, resp. rate 16, height 5\' 6"  (1.676 m), weight 156 lb (70.761 kg).  Physical Exam Physical Exam  Constitutional: He is oriented to  person, place, and time. He appears well-developed.  Eyes: Conjunctivae are normal.  Neck: Neck supple.  Cardiovascular: Normal rate and regular rhythm.   Murmur heard. 1 + bilateral lower leg nonpitting edema  Pulmonary/Chest: Effort normal and breath sounds normal.  Abdominal: Soft. Normal appearance and bowel sounds are normal. A hernia is present. Hernia confirmed positive in the right inguinal area.  Right inguinal hernia that is reducible   Lymphadenopathy:    He has no cervical adenopathy.  Neurological: He is alert and oriented to person, place, and time.  Skin: Skin is warm and dry.    Data Reviewed Chest x-ray from his 09/14/2013 ED evaluation showed no active cardiopulmonary disease.  These films were reviewed.  CT of the abdomen and pelvis dated 11/15/2013 suggested thickening of the duodenal wall, possibly related to duodenitis. Ulceration cannot be excluded. Upper GI or endoscopy recommended. Nodular prostate. Obstipation.  These films were reviewed.  Assessment    Right inguinal hernia, likely secondary to episode of pneumonia in February 2015, symptomatic.  Long-standing Parkinson's.  Recent pneumonia.  Long-standing cardiac disease with angina.  Possible duodenitis.    Plan    The patient's present symptoms may be due to the acute onset of the hernia, as it is easily reducible in the supine position and easily appreciated when standing. He has multiple medical comorbidities that would make surgical intervention at high-risk than normal.  I cannot elicit a history suggestive of urinary overflow, and the bladder did not appear overdistended at the time of his April CT.  An upper GI series will be obtained to assess for duodenitis with the patient's reported worsening symptoms on a regular diet.  Cardiology evaluation will be obtained.  Once these studies have been reviewed we'll discuss whether operative intervention is appropriate.    Hernia  precautions and incarceration were discussed with the patient. If they develop symptoms of an incarcerated hernia, they were encouraged to seek prompt medical attention.  I have recommended repair of the hernia using mesh on an outpatient basis after cardiac clearence. The risk of infection was reviewed. The role of prosthetic mesh to minimize the risk of recurrence was reviewed.  Patient has been scheduled with Dr Arnoldo Hooker at [facility] for 12/13/13 at 3:45 pm.   PCP: [name]  Cardiology Dr. Narda Rutherford Urias Sheek 11/29/2013, 1:27 PM

## 2013-11-29 ENCOUNTER — Telehealth: Payer: Self-pay

## 2013-11-29 ENCOUNTER — Telehealth: Payer: Self-pay | Admitting: *Deleted

## 2013-11-29 DIAGNOSIS — I209 Angina pectoris, unspecified: Secondary | ICD-10-CM | POA: Insufficient documentation

## 2013-11-29 DIAGNOSIS — K4090 Unilateral inguinal hernia, without obstruction or gangrene, not specified as recurrent: Secondary | ICD-10-CM | POA: Insufficient documentation

## 2013-11-29 DIAGNOSIS — R110 Nausea: Secondary | ICD-10-CM | POA: Insufficient documentation

## 2013-11-29 MED ORDER — HYDROCODONE-ACETAMINOPHEN 5-325 MG PO TABS: 1.0000 | ORAL_TABLET | Freq: Four times a day (QID) | ORAL | Status: DC | PRN

## 2013-11-29 NOTE — Telephone Encounter (Signed)
Faxed request from [facility] pharmacy for HYDROCODONE 5-325 mg take 1 tablet three times daily, last filled 11/16/13 by [facility]. Please advise not on med list

## 2013-11-29 NOTE — Telephone Encounter (Signed)
Message copied by Sinda DuKENNEDY, CARYL-LYN M on Thu Nov 29, 2013  2:40 PM ------      Message from: [place], [place] W      Created: Thu Nov 29, 2013  1:36 PM       Please arrange for an upper GI: Diagnosis duodenal thickening on April 2015 CT, assess for partial obstruction or ulceration. Thank you ------

## 2013-11-29 NOTE — Telephone Encounter (Signed)
Spoke with patient's wife about having an Upper GI series completed at the hospital and patient is amendable to this. Patient is scheduled for an Upper [facility] on 12/05/13 at 9:30 am. He is to arrive at 9:00 am and his is to have nothing to eat or drink after midnight the night before. He may take any necessary medications first thing in the morning with a small sip of water. Patient is aware or date, time, and all instructions.

## 2013-11-30 NOTE — Telephone Encounter (Signed)
Spoke with patient's wife and advised results  

## 2013-12-03 ENCOUNTER — Ambulatory Visit (INDEPENDENT_AMBULATORY_CARE_PROVIDER_SITE_OTHER): Payer: Medicare Other | Admitting: Neurology

## 2013-12-03 ENCOUNTER — Encounter: Payer: Self-pay | Admitting: Neurology

## 2013-12-03 VITALS — BP 140/54 | HR 84 | Resp 16 | Ht 66.0 in | Wt 159.0 lb

## 2013-12-03 DIAGNOSIS — G3184 Mild cognitive impairment, so stated: Secondary | ICD-10-CM

## 2013-12-03 DIAGNOSIS — K4090 Unilateral inguinal hernia, without obstruction or gangrene, not specified as recurrent: Secondary | ICD-10-CM

## 2013-12-03 DIAGNOSIS — G20 Parkinson's disease: Secondary | ICD-10-CM

## 2013-12-03 DIAGNOSIS — F0280 Dementia in other diseases classified elsewhere without behavioral disturbance: Secondary | ICD-10-CM

## 2013-12-03 NOTE — Progress Notes (Addendum)
[name] Rich was seen today in the movement disorders clinic for neurologic consultation at the request of [name] Abideichard Letvak, MD.  The consultation is for the evaluation of Parkinsons disease.  The patient has previously seen Dr. Misty Rich.  I received and reviewed those records This patient is accompanied in the office by his spouse who supplements the history.  The patient reports that his first PD sx was when he was living in [place] perhaps in the year 2000.  He had L hand tremor.  He had a long hx of ET but this was a new resting tremor for him.  He was told he had PD.  He started on levodopa not long thereafter.  He did not think that the levodopa helped and he still is not sure that it helps.  However, he states that on one time he had stopped comtan and the tremor restarted.  He states that he restarted his comtan and within 20 min tremor was better.  Today, he tells me the same story but relates that the improvement was with the Mirapex and not the Comtan.    He takes levodopa 4 times per day.  He is on immediate release formulation.  Last visit, I did feel that the patient was underdosed.  After reviewing his [facility] records, they also felt that he was underdosed but the patient did not want to change his medication.  Interestingly, the patient recently called because of lightheadedness and wanted to go off of the levodopa.  However, I told him that while the levodopa can cause this, the Mirapex was likely a greater offender.  We told him that he decrease the Mirapex, but he ended up discontinuing it without a wean and felt terrible.  When we checked in with him a few days later, we told him to go back on the medication but at such a high dosage.  He was to go back to 0.75 mg twice a day and he is on that now.  Lightheadedness has improved greatly but he still had one episode where BP dropped to the mid 70s over 40s.  I noticed his primary care doctor address this and try to change his terazosin to tamsulosin.   The patient does not recall this and comes in today on terazosin.  He has no wearing off.    He has freezing in closets and when he turns, but he never really gets stuck.  He does admit to frustration that tremor was much better controlled years ago.    Update 01/01/13:  Since last visit, he did see his primary care physician.  I reviewed the notes.  He was placed on Exelon patch but he did not take it because it was too costly.  His primary care physician did discontinue his terazosin.  We have been working on weaning his Mirapex because orthostatic hypotension and cognitive changes.  He is currently on Mirapex 0.25 mg, 3 tablets twice a day.  His dizziness has been much better since the terazosin was discontinued.  He is also on carbidopa/levodopa 25/100 on the following schedule: 2/1.5/2/1.5.  He is going to PT in [place].  He has only been one time for an evaluation.  He is scheduled to go two days per week, for PT and OT.    He notes that his L hand has been been falling asleep, and involves all fingers but not the thumb.    04/04/13 update:    The patient is following up today regarding his ET/PD.  He is currently on carbidopa/levodopa  25/100 on the following schedule: 2/1.5/2/1.5.  Last visit, we added carbidopa/levodopa 50/200 CR at bedtime.  He remains on pramipexole 0.25 mg, 3 tablets twice per day.  He has been to physical therapy and thinks that that helps.  He is involved with the dance for PD study through [facility] and is enjoying it and thinks it helps.  I added the Exelon patch last visit because of cognitive changes, but he felt that it made his Parkinson's symptoms worse.  He stopped it.  He is not exercising.  Cognitively, he is doing well.  He is still able to play bridge with his friends, although the tremor does interfere with holding the cards.  He did fall yesterday when he was going up a curb, but his wife caught him before he hit the ground.  He has had no other falls.  He has had no  hallucinations.  He did have one very vivid dreaming he screamed out.  07/24/13 update:  The pt is f/u today, accompanied by his wife who supplements the hx.  He ran out of the effexor and d/c it, which was detrimental according to the wife.  He c/o weakness and dizziness.  He has had more confusion.  He doesn't drive.  He had a near fall yesterday but no real falls since last visit.  He was supposed to hold the mirapex and levodopa prior to todays visit but forgot.  I did decrease the mirapex from 0.75 mg bid to 0.5 mg bid and he has been some better mentally but he is recovering from the flu and his wife isn't sure if that's why she saw the improvement.  He is on carbidopa/levodopa 25/100, 2 in the AM, 1.5 in the afternoon, 2 in the evening and he may or may not take another 1.5 at bed.  He was supposed to be on the carbidopa/levodopa 50/200 at night but got confused and d/c that.  He is not exercising.  He is painting which his wife states has helped mentally as well.      08/03/13 update:  The pt was seen much earlier than anticipated.  He is accompanied by his wife who supplements the hx.  10 days ago, I started weaning him off of his mirapex.  He is on carbidopa/levodopa 25/100, 2 in the AM, 1.5 in the afternoon, 2 in the evening and he may or may not take another 1.5 at bed.  He is concerned and wanted to be seen earlier than scheduled because it takes him about an hour and a half to be able to feel good in the morning.  Before that, his proximal legs and pelvic muscles feel weak.  In addition, he complains of shortness of breath until his dopamine kicks in.  Memory seems like it dramatically deteriorated after he had the flu at the end of last year.  His wife does state that he has gotten somewhat better, but he still is not able to play bridge like he was.  08/30/13 update:  The patient is accompanied by his wife and daughter, [name] Rich, who supplement the history.  The patient is off of Mirapex now, but  unfortunately and not surprisingly, tremor increased.  This has become very bothersome for him.  Over the last week or so he has had more difficulty sleeping.  He did restart his Effexor about 10 days ago.  While the clonazepam definitely helped him sleep, his wife noticed that he seemed weaker  the morning after he took it.  It did not cause cognitive change, however.  In fact, since going off of the Mirapex, her cognition has been better.  He is currently taking carbidopa/levodopa 25/100, 2 tablets in the morning, 1.5 tablets in the afternoon, 2 tablets in the later afternoon and one and a half tablets in the late evening.  He remains on carbidopa/levodopa 50/200 at nighttime.  He is also on entacapone 4 times per day.  12/03/13 update:  The patient is accompanied by his wife, who supplements the history.  The patient is currently on carbidopa/levodopa 25/100, 2 tablets every 3 hours from 8am to 8 pm and then midnight and 4 am he gets one.  He takes a total of 14 tablets per day.  He still feels like the left arm is "frozen."  Records were reviewed since last visit.  The patient fell on February 19 at 1 AM.  He was trying to get up in the middle of the night to use the bathroom.  He walked a few steps and fell directly on the tailbone.  Notes in the chart suggested that perhaps there were some loss of consciousness, but he denied that today.  On February 25, the patient was admitted to [facility] after having a syncopal episode. Pt states that he had finished breakfast and he felt weak all over and he said he thought he was going to die.  His wife pushed him via the walker into the recliner, he said, "tell the children I said goodbye."  He then lost consciousness in the recliner.  Did not lose control of bladder or bowel.  EMS was contacted.  Unknown how long he lost consciousness.  The patient was diagnosed with pneumonia.  He subsequently was discharged to a subacute nursing facility for  rehabilitation [facility].  Unfortunately, his discharge course has been complicated by an inguinal hernia and he is now being worked up to see if he is a surgical candidate for a right inguinal hernia repair.  His wife purchased a hernia belt, and he does state that that has been somewhat helpful for the pain.   PREVIOUS MEDICATIONS: Sinemet and Mirapex, comtan, Exelon patch (felt that he made Parkinson's symptoms worse); sinemet CR at bed (accidentally d/c the med and just not restarted)  ALLERGIES:   Allergies  Allergen Reactions  . Morphine And Related     Passed out in hospital  . Xylocaine-Epinephrine [Lidocaine-Epinephrine] Other (See Comments)    faint  . Exelon [Rivastigmine]     Felt "real bad"    CURRENT MEDICATIONS:  Current Outpatient Prescriptions on File Prior to Visit  Medication Sig Dispense Refill  . aspirin 81 MG tablet Take 81 mg by mouth daily.      . carbidopa-levodopa (SINEMET IR) 25-100 MG per tablet Take 2 tablets by mouth 4 (four) times daily.  240 tablet  5  . cholecalciferol (VITAMIN D) 1000 UNITS tablet Take 2,000 Units by mouth daily.       . entacapone (COMTAN) 200 MG tablet Take 200 mg by mouth 4 (four) times daily.      . finasteride (PROSCAR) 5 MG tablet Take 1 tablet (5 mg total) by mouth daily.  90 tablet  3  . HYDROcodone-acetaminophen (NORCO/VICODIN) 5-325 MG per tablet Take 1 tablet by mouth every 6 (six) hours as needed for moderate pain.  30 tablet  0  . lovastatin (MEVACOR) 40 MG tablet Take 0.5 tablets (20 mg total)  by mouth at bedtime.  15 tablet  0  . Polyethylene Glycol 3350 (MIRALAX PO) Take by mouth daily as needed.      Marland Kitchen RANEXA 500 MG 12 hr tablet TAKE ONE TABLET TWICE A DAY  60 tablet  11  . venlafaxine (EFFEXOR) 75 MG tablet Take 1 tablet (75 mg total) by mouth daily.  15 tablet  0   No current facility-administered medications on file prior to visit.    PAST MEDICAL HISTORY:   Past Medical History  Diagnosis Date  .  Hyperlipidemia   . Coronary artery disease 2010    Dr Gwen Pounds  . Anxiety   . Osteoarthritis of foot   . Parkinson's disease     Dr Kennyth Arnold at [facility] - 2000  . BPH (benign prostatic hypertrophy)   . Essential tremor   . Osteoporosis 2012    forearm only    PAST SURGICAL HISTORY:   Past Surgical History  Procedure Laterality Date  . Coronary stent placement  3/10  . Cholecystectomy    . Tonsillectomy and adenoidectomy  childhood  . Lumbosacral fusion  1978  . Joint replacement  1999    Left knee TKR  . Carpal tunnel release      left  . Cataract extraction      right    SOCIAL HISTORY:   History   Social History  . Marital Status: Married    Spouse Name: N/A    Number of Children: 5  . Years of Education: N/A   Occupational History  . Retired Insurance underwriter    Social History Main Topics  . Smoking status: Never Smoker   . Smokeless tobacco: Never Used  . Alcohol Use: No     Comment: very little  . Drug Use: No  . Sexual Activity: Not on file   Other Topics Concern  . Not on file   Social History Narrative   Daughter in [place]. 4 other children ([place], [place], [place], [place] to [place])   Has living will   Wife is health care POA (then daughter Fleet Contras)   Would accept resuscitation attempts   Probably would not want tube feeds if cognitively unaware    FAMILY HISTORY:   Family Status  Relation Status Death Age  . Mother Deceased 79    CHF  . Father Deceased 8    MI, ET  . Brother Deceased     ?liver cancer  . Brother Alive     DM-2, CAD  . Child Alive     5, alive and well    ROS:    A complete 10 system review of systems was obtained and was unremarkable apart from what is mentioned above.  PHYSICAL EXAMINATION:    VITALS:   Filed Vitals:   12/03/13 1244  BP: 140/54  Pulse: 84  Resp: 16  Height: 5\' 6"  (1.676 m)  Weight: 159 lb (72.122 kg)    GEN:  The patient appears stated age and is in  NAD. HEENT:  Normocephalic, atraumatic.  The mucous membranes are moist. The superficial temporal arteries are without ropiness or tenderness. CV:  RRR with 3/6 SEM Lungs:  CTAB Neck/HEME:  There are no carotid bruits bilaterally.  Neurological examination:  Orientation: A MoCA was done 01/01/13, and the patient scored a 21/30.  08/03/13 MoCA was also 21/30.  He is oriented today but looks to his wife for the finer details.   Movement examination: Tone: There is moderate rigidity  in the left upper extremity. Abnormal movements: UE resting tremor is present bilaterally, L>R  and in the L foot.  It is moderate in nature.  After the addition of levodopa in the office dissolved in ginger ale (200 mg levodopa), tremor greatly decreased with only mild tremor in the LUE remaining. Coordination:  There is mild decremation with RAM's, seen more on the L than R. he is able to perform heel taps fairly well, but has trouble with toe taps. Gait and Station: The patient has difficulty arising out of a deep-seated chair without the use of the hands.  The patient is able to ambulate with a walker, without short steps until he begins to turn.   LABS  Lab Results  Component Value Date   WBC 7.6 11/08/2013   HGB 12.8* 11/08/2013   HCT 37.8* 11/08/2013   MCV 94.1 11/08/2013   PLT 311.0 11/08/2013     Chemistry      Component Value Date/Time   NA 135 11/08/2013 1719   K 4.2 11/08/2013 1719   CL 99 11/08/2013 1719   CO2 27 11/08/2013 1719   BUN 21 11/08/2013 1719   CREATININE 1.2 11/08/2013 1719      Component Value Date/Time   CALCIUM 9.8 11/08/2013 1719   ALKPHOS 56 11/08/2013 1719   AST 17 11/08/2013 1719   ALT 8 11/08/2013 1719   BILITOT 0.7 11/08/2013 1719     Lab Results  Component Value Date   TSH 3.71 11/08/2013     ASSESSMENT/PLAN:  1.  ET/PD  -He will continue the carbidopa/levodopa 25/100, up to 14 tablets per day.  He was fairly rigid today, and I have to wonder if he is not having protein  interfere with this medication.  He is drinking Ensure and has a fairly high-protein diet.  I asked him to watch this for the next week and see how he does.  We talked about duopa. 2.  Cognitive changes.  -We talked about the cognitive disorders associated with Parkinson's disease.  Talked about the fact that medication can exacerbate this, as above.  -He thought that the Exelon patch made him worse.    -MoCA has been stable.  Improved with d/c of mirapex.   3.  probable orthostatic hypotension.  -Again, this is likely related to Parkinson's.  The discontinuation of terazosin and f Mirapex seems to have helped.   4.  Depression  -back on effexor  -Move effexor to AM dosing; may be interfering with sleep  -Decrease klonopin to 0.5 mg - 1/4 tab at night to help with sleep 5.  right inguinal hernia.  -If he does need surgical intervention, then he and I talked about the fact that his Parkinson's symptoms may get worse after general anesthesia.  So, he likely will need therapy.  We may need to adjust his Parkinson's medications.  If at all possible, avoiding Phenergan postoperatively would be good.  Zofran would be a better choice. 6.  followup in the next few months, sooner should new neurologic issues arise.

## 2013-12-05 ENCOUNTER — Telehealth: Payer: Self-pay | Admitting: *Deleted

## 2013-12-05 ENCOUNTER — Encounter: Payer: Self-pay | Admitting: Internal Medicine

## 2013-12-05 ENCOUNTER — Ambulatory Visit: Payer: Self-pay | Admitting: General Surgery

## 2013-12-05 ENCOUNTER — Encounter: Payer: Self-pay | Admitting: General Surgery

## 2013-12-05 NOTE — Telephone Encounter (Signed)
Notified patient as instructed, patient pleased. Discussed follow-up appointments, wife to call back tomorrow to make appt as pt requested.

## 2013-12-05 NOTE — Telephone Encounter (Signed)
Message copied by Currie Paris on Wed Dec 05, 2013  2:56 PM ------      Message from: [place], [place] W      Created: Wed Dec 05, 2013  2:50 PM       Please let the patient/family know that the GI study showed no abnormality as suggested on CT scan. After his upcoming cardiology appointment on May 28th, will get together to decide whether surgical intervention is needed. Confirm he has a followup appointment please      ----- Message -----         From: Jena Gauss, CMA         Sent: 12/05/2013   1:11 PM           To: Earline Mayotte, MD                   ------

## 2014-02-06 ENCOUNTER — Ambulatory Visit (INDEPENDENT_AMBULATORY_CARE_PROVIDER_SITE_OTHER): Payer: Medicare Other | Admitting: Internal Medicine

## 2014-02-06 ENCOUNTER — Encounter: Payer: Self-pay | Admitting: Internal Medicine

## 2014-02-06 VITALS — BP 120/60 | HR 77 | Temp 98.1°F | Wt 158.0 lb

## 2014-02-06 DIAGNOSIS — K4090 Unilateral inguinal hernia, without obstruction or gangrene, not specified as recurrent: Secondary | ICD-10-CM

## 2014-02-06 DIAGNOSIS — N400 Enlarged prostate without lower urinary tract symptoms: Secondary | ICD-10-CM

## 2014-02-06 NOTE — Progress Notes (Signed)
Pre visit review using our clinic review tool, if applicable. No additional management support is needed unless otherwise documented below in the visit note. 

## 2014-02-06 NOTE — Assessment & Plan Note (Signed)
More trouble voiding On proscar Risky to try alpha blocker or tamsulosin (increased risk of falls)---will hold off

## 2014-02-06 NOTE — Progress Notes (Signed)
Subjective:    Patient ID: [name] Rich, male    DOB: 1929-05-02, 80 y.o.   MRN: [id number]  HPI Here with wife  Hernia seems to be bigger and harder Still has pain with it--but less Now feels it may be "attached to the penis"  Did get okay from the cardiologist to proceed with surgical repair  Current Outpatient Prescriptions on File Prior to Visit  Medication Sig Dispense Refill  . aspirin 81 MG tablet Take 81 mg by mouth daily.      . carbidopa-levodopa (SINEMET IR) 25-100 MG per tablet Take 2 tablets by mouth 4 (four) times daily.  240 tablet  5  . cholecalciferol (VITAMIN D) 1000 UNITS tablet Take 2,000 Units by mouth daily.       . entacapone (COMTAN) 200 MG tablet Take 200 mg by mouth 4 (four) times daily.      . finasteride (PROSCAR) 5 MG tablet Take 1 tablet (5 mg total) by mouth daily.  90 tablet  3  . lovastatin (MEVACOR) 40 MG tablet Take 0.5 tablets (20 mg total) by mouth at bedtime.  15 tablet  0  . Polyethylene Glycol 3350 (MIRALAX PO) Take by mouth daily as needed.      Marland Kitchen RANEXA 500 MG 12 hr tablet TAKE ONE TABLET TWICE A DAY  60 tablet  11  . venlafaxine (EFFEXOR) 75 MG tablet Take 1 tablet (75 mg total) by mouth daily.  15 tablet  0   No current facility-administered medications on file prior to visit.    Allergies  Allergen Reactions  . Morphine And Related     Passed out in hospital  . Xylocaine-Epinephrine [Lidocaine-Epinephrine] Other (See Comments)    faint  . Exelon [Rivastigmine]     Felt "real bad"    Past Medical History  Diagnosis Date  . Hyperlipidemia   . Coronary artery disease 2010    Dr Gwen Pounds  . Anxiety   . Osteoarthritis of foot   . Parkinson's disease     Dr Kennyth Arnold at [facility] - 2000  . BPH (benign prostatic hypertrophy)   . Essential tremor   . Osteoporosis 2012    forearm only    Past Surgical History  Procedure Laterality Date  . Coronary stent placement  3/10  . Cholecystectomy    . Tonsillectomy and adenoidectomy   childhood  . Lumbosacral fusion  1978  . Joint replacement  1999    Left knee TKR  . Carpal tunnel release      left  . Cataract extraction      right    Family History  Problem Relation Age of Onset  . Heart disease Mother   . Stroke Father   . Diabetes Father   . Cancer Brother   . Heart disease Brother   . Hypertension Brother   . Diabetes Brother     History   Social History  . Marital Status: Married    Spouse Name: N/A    Number of Children: 5  . Years of Education: N/A   Occupational History  . Retired Insurance underwriter    Social History Main Topics  . Smoking status: Never Smoker   . Smokeless tobacco: Never Used  . Alcohol Use: No     Comment: very little  . Drug Use: No  . Sexual Activity: Not on file   Other Topics Concern  . Not on file   Social History Narrative   Daughter in [place].  4 other children ([place]moving to [place]   Has living will   Wife is health care POA (then daughter Fleet ContrasRachel)   Would accept resuscitation attempts   Probably would not want tube feeds if cognitively unaware   Review of Systems Appetite isn't great---has gone down lately Bowels are fine with miralax in general Notes urinary hesitancy and decreased stream. This seems to be worse    Objective:   Physical Exam  Genitourinary:  Right inguinal hernia is clearly bigger and into scrotum Easily reduceable when supine (though with mild discomfort as gas moving around in the bowel)          Assessment & Plan:

## 2014-02-06 NOTE — Assessment & Plan Note (Signed)
Clearly larger and bothers him but reduces easier. Overall pain is better than before (probably since it is a bigger defect now) Discussed surgical risks due to his Parkinson's---could need inpatient rehab He feels he would like to wait  Has truss of some sort--but tough to use with straps around his legs. Discussed using it only in day (not at night when he is disturbed by frequent nocturia)

## 2014-02-07 ENCOUNTER — Telehealth: Payer: Self-pay

## 2014-02-07 NOTE — Telephone Encounter (Signed)
Spoke with patient's wife Gloriajean Delladine to see if he had a consult with his cardiologist. She states that he did see Dr Gwen PoundsKowalski in June and has been cleared for surgery. He also saw Dr Alphonsus SiasLetvak yesterday and discussed concerns with anesthesia and his Parkinson's disease. She said that they still have not decided weather or not to have the surgery done. She said that the hernia is not bothering him at this time. She said she would call back once they decide what to do.

## 2014-02-07 NOTE — Telephone Encounter (Signed)
Message copied by Sinda Du on Thu Feb 07, 2014  1:32 PM ------      Message from: Fults, [place] W      Created: Wed Feb 06, 2014  1:47 PM       See if patient planning on repair of his hernia and confirm he saw Dr. Gwen Pounds in May for cardiac assessment. If amenable for surgery, he will need preop appt.      ----- Message -----         From: Karie Schwalbe, MD         Sent: 02/06/2014   1:01 PM           To: Earline Mayotte, MD                   ------

## 2014-02-20 ENCOUNTER — Encounter: Payer: Self-pay | Admitting: General Surgery

## 2014-02-20 NOTE — Progress Notes (Signed)
Please check with them whether they want to pursue the surgery on the hernia

## 2014-02-20 NOTE — Progress Notes (Signed)
Patient ID: [name] Rich, male   DOB: [birth year], 80 y.o.   MRN: [id number]  Cardiology notes dated 01/08/2014 were reviewed. The patient underwent a pharmacological stress test showing no evidence of myocardial ischemia on perfusion scanning or ECG.  Cardiac echo exam dated 01/01/2014 showed a thickened aortic valve with hypercontractile LV, left ventricular hypertrophy, moderate mitral regurgitation.  Last phone contact with the patient's family report of the hernia was asymptomatic and they were deciding whether to proceed with elective repair.  Will await contact from the patient regarding surgical repair.

## 2014-03-07 ENCOUNTER — Ambulatory Visit (INDEPENDENT_AMBULATORY_CARE_PROVIDER_SITE_OTHER): Payer: Medicare Other | Admitting: General Surgery

## 2014-03-07 ENCOUNTER — Encounter: Payer: Self-pay | Admitting: General Surgery

## 2014-03-07 VITALS — BP 110/58 | HR 58 | Resp 12 | Ht 66.0 in | Wt 159.0 lb

## 2014-03-07 DIAGNOSIS — K4090 Unilateral inguinal hernia, without obstruction or gangrene, not specified as recurrent: Secondary | ICD-10-CM

## 2014-03-07 NOTE — Patient Instructions (Addendum)

## 2014-03-07 NOTE — Progress Notes (Signed)
Patient ID: [name] Rich, male   DOB: 04-14-1929, 80 y.o.   MRN: [id number]  Chief Complaint  Patient presents with  . Follow-up    pre op for reassessment of inguinal hernia    HPI [name] Rich is a 80 y.o. male who presents for a reassessment of inguinal hernia. He states the areaa is larger but there no pain. The patient was really seen in May 2015.CT scan at that time raised a question about possible duodenal thickening, subsequent barium study was negative. Wife states he is having trouble with urination. When questioned, the patient reports that the swelling from the scrotal hernia direct for urinary stream away from the toilet bowl. From the look on his wife's face she is tired of cleaning up after his poor aim.  HPI  Past Medical History  Diagnosis Date  . Hyperlipidemia   . Coronary artery disease 2010    Dr Gwen Pounds  . Anxiety   . Osteoarthritis of foot   . Parkinson's disease     Dr Kennyth Arnold at [facility] - 2000  . BPH (benign prostatic hypertrophy)   . Essential tremor   . Osteoporosis 2012    forearm only    Past Surgical History  Procedure Laterality Date  . Coronary stent placement  3/10  . Cholecystectomy    . Tonsillectomy and adenoidectomy  childhood  . Lumbosacral fusion  1978  . Joint replacement  1999    Left knee TKR  . Carpal tunnel release      left  . Cataract extraction      right    Family History  Problem Relation Age of Onset  . Heart disease Mother   . Stroke Father   . Diabetes Father   . Cancer Brother   . Heart disease Brother   . Hypertension Brother   . Diabetes Brother     Social History History  Substance Use Topics  . Smoking status: Never Smoker   . Smokeless tobacco: Never Used  . Alcohol Use: No     Comment: very little    Allergies  Allergen Reactions  . Morphine And Related     Passed out in hospital  . Xylocaine-Epinephrine [Lidocaine-Epinephrine] Other (See Comments)    faint  . Exelon [Rivastigmine]     Felt "real  bad"    Current Outpatient Prescriptions  Medication Sig Dispense Refill  . aspirin 81 MG tablet Take 81 mg by mouth daily.      . carbidopa-levodopa (SINEMET IR) 25-100 MG per tablet Take 2 tablets by mouth 4 (four) times daily.  240 tablet  5  . cholecalciferol (VITAMIN D) 1000 UNITS tablet Take 2,000 Units by mouth daily.       . entacapone (COMTAN) 200 MG tablet Take 200 mg by mouth 4 (four) times daily.      . finasteride (PROSCAR) 5 MG tablet Take 1 tablet (5 mg total) by mouth daily.  90 tablet  3  . lovastatin (MEVACOR) 40 MG tablet Take 0.5 tablets (20 mg total) by mouth at bedtime.  15 tablet  0  . Polyethylene Glycol 3350 (MIRALAX PO) Take by mouth daily as needed.      Marland Kitchen RANEXA 500 MG 12 hr tablet TAKE ONE TABLET TWICE A DAY  60 tablet  11  . venlafaxine (EFFEXOR) 75 MG tablet Take 1 tablet (75 mg total) by mouth daily.  15 tablet  0   No current facility-administered medications for this visit.    Review of  Systems Review of Systems  Constitutional: Negative.   Respiratory: Negative.   Cardiovascular: Negative.   Genitourinary: Positive for frequency and difficulty urinating.    Blood pressure 110/58, pulse 58, resp. rate 12, height 5\' 6"  (1.676 m), weight 159 lb (72.122 kg).  Physical Exam Physical Exam  Constitutional: He is oriented to person, place, and time. He appears well-developed and well-nourished.  Neck: Neck supple.  Cardiovascular: Normal rate and regular rhythm.   Murmur heard.  Systolic murmur is present with a grade of 3/6  Murmur unchanged.  Pulmonary/Chest: Effort normal and breath sounds normal.  Abdominal: Normal appearance. A hernia is present. Hernia confirmed positive in the right inguinal area (a largeright inguinal hernia extending down into the proximal scrotum was identified.).  Lymphadenopathy:    He has no cervical adenopathy.  Neurological: He is alert and oriented to person, place, and time.  Skin: Skin is warm and dry.        Assessment    Symptomatic right scrotal hernia.     Plan    The patient has become more symptomatic, and elective repair appears appropriate. Risks of surgery were reviewed in detail with the patient, his wife and daughter (who was present for the interview and exam). The possibility of spinal anesthesia was discussed, and the family and patient were more encouraged about this the general anesthesia. There is a concern that general anesthesia might aggravate his Parkinson's.      Hernia precautions and incarceration were discussed with the patient. If they develop symptoms of an incarcerated hernia, they were encouraged to seek prompt medical attention.  I have recommended repair of the hernia using mesh on an outpatient basis in the near future. The risk of infection was reviewed. The role of prosthetic mesh to minimize the risk of recurrence was reviewed.  Patient's surgery has been [facility] for [facility].  PCP: Georgiann MohsLetvak, Richard    Youssouf Shipley W 03/08/2014, 8:58 PM

## 2014-03-08 ENCOUNTER — Other Ambulatory Visit: Payer: Self-pay | Admitting: General Surgery

## 2014-03-08 DIAGNOSIS — K469 Unspecified abdominal hernia without obstruction or gangrene: Secondary | ICD-10-CM | POA: Insufficient documentation

## 2014-03-08 DIAGNOSIS — K4090 Unilateral inguinal hernia, without obstruction or gangrene, not specified as recurrent: Secondary | ICD-10-CM

## 2014-03-12 ENCOUNTER — Ambulatory Visit: Payer: Self-pay | Admitting: General Surgery

## 2014-03-12 ENCOUNTER — Telehealth: Payer: Self-pay

## 2014-03-12 LAB — CBC WITH DIFFERENTIAL/PLATELET
BASOS PCT: 1.1 %
Basophil #: 0.1 10*3/uL (ref 0.0–0.1)
EOS ABS: 0 10*3/uL (ref 0.0–0.7)
Eosinophil %: 0.5 %
HCT: 38.8 % — AB (ref 40.0–52.0)
HGB: 13.2 g/dL (ref 13.0–18.0)
LYMPHS ABS: 1.2 10*3/uL (ref 1.0–3.6)
LYMPHS PCT: 19.3 %
MCH: 31.4 pg (ref 26.0–34.0)
MCHC: 34.1 g/dL (ref 32.0–36.0)
MCV: 92 fL (ref 80–100)
MONO ABS: 0.8 x10 3/mm (ref 0.2–1.0)
Monocyte %: 12.7 %
NEUTROS ABS: 4.2 10*3/uL (ref 1.4–6.5)
Neutrophil %: 66.4 %
PLATELETS: 239 10*3/uL (ref 150–440)
RBC: 4.21 10*6/uL — ABNORMAL LOW (ref 4.40–5.90)
RDW: 15.7 % — ABNORMAL HIGH (ref 11.5–14.5)
WBC: 6.3 10*3/uL (ref 3.8–10.6)

## 2014-03-12 NOTE — Telephone Encounter (Signed)
Patient was seen in Pre admission testing at [facility] today. There were some concerns about spinal anesthesia due to his history of Lumbosacral fusion done in the past. She would like to know if he is still all right with having surgery done with this type of anesthesia. She would like a call back about this.

## 2014-03-13 NOTE — Telephone Encounter (Signed)
Spinal anesthesia may not be possible.The family will have to decide if they want to proceed under general is needed.

## 2014-03-14 ENCOUNTER — Telehealth: Payer: Self-pay

## 2014-03-14 NOTE — Telephone Encounter (Signed)
I spoke with the patient's wife and let her know that Dr Lemar Livings said that spinal anesthesia may not be possible, and that the family will have to decide if they want to proceed under general anesthesia. She expressed disappointment, but understanding, and would get back with [place] once she makes her decision.

## 2014-03-18 ENCOUNTER — Telehealth: Payer: Self-pay

## 2014-03-18 NOTE — Telephone Encounter (Signed)
Patient's wife [name] called and said that after thinking it over she said they would like to precede with his scheduled surgery for 03/20/14 using general surgery if spinal would not be possible. I let her know that I would notify Dr Lemar Livings of this. I have also spoken with Cordelia Pen in Pre admission testing and let her know of their decision.

## 2014-03-19 ENCOUNTER — Ambulatory Visit: Payer: Self-pay | Admitting: Hospice and Palliative Medicine

## 2014-03-19 ENCOUNTER — Ambulatory Visit: Payer: Medicare Other | Admitting: General Surgery

## 2014-03-20 ENCOUNTER — Ambulatory Visit: Payer: Self-pay | Admitting: General Surgery

## 2014-03-20 DIAGNOSIS — K4090 Unilateral inguinal hernia, without obstruction or gangrene, not specified as recurrent: Secondary | ICD-10-CM

## 2014-03-20 HISTORY — PX: HERNIA REPAIR: SHX51

## 2014-03-21 ENCOUNTER — Encounter: Payer: Self-pay | Admitting: General Surgery

## 2014-03-22 LAB — PATHOLOGY REPORT

## 2014-03-26 ENCOUNTER — Encounter: Payer: Self-pay | Admitting: General Surgery

## 2014-03-27 ENCOUNTER — Other Ambulatory Visit: Payer: Self-pay | Admitting: Internal Medicine

## 2014-03-27 ENCOUNTER — Ambulatory Visit (INDEPENDENT_AMBULATORY_CARE_PROVIDER_SITE_OTHER): Payer: Self-pay | Admitting: General Surgery

## 2014-03-27 ENCOUNTER — Encounter: Payer: Self-pay | Admitting: General Surgery

## 2014-03-27 VITALS — BP 110/70 | HR 80 | Resp 12 | Ht 66.0 in | Wt 163.0 lb

## 2014-03-27 DIAGNOSIS — K4090 Unilateral inguinal hernia, without obstruction or gangrene, not specified as recurrent: Secondary | ICD-10-CM

## 2014-03-27 NOTE — Patient Instructions (Signed)
The patient is aware to call back for any questions or concerns.  

## 2014-03-27 NOTE — Progress Notes (Signed)
Patient ID: [name] Rich, male   DOB: Jan 18, 1929, 80 y.o.   MRN: [id number]  Chief Complaint  Patient presents with  . Routine Post Op    inguinal hernia    HPI [name] Rich is a 80 y.o. male.  Here today for postoperative visit, scrotal hernia repair 03-20-14. States he is doing well. Minimal to no pain. Miralax is helping his bowels to move. Denies any nausea or vomiting. No urinary difficulties post procedure.  HPI  Past Medical History  Diagnosis Date  . Hyperlipidemia   . Coronary artery disease 2010    Dr Gwen Pounds  . Anxiety   . Osteoarthritis of foot   . Parkinson's disease     Dr Kennyth Arnold at [facility] - 2000  . BPH (benign prostatic hypertrophy)   . Essential tremor   . Osteoporosis 2012    forearm only    Past Surgical History  Procedure Laterality Date  . Coronary stent placement  3/10  . Cholecystectomy    . Tonsillectomy and adenoidectomy  childhood  . Lumbosacral fusion  1978  . Joint replacement  1999    Left knee TKR  . Carpal tunnel release      left  . Cataract extraction      right  . Hernia repair Right 03-20-14    Right scrotal hernia,large Ultrapro mesh, excision of angiolipoma.    Family History  Problem Relation Age of Onset  . Heart disease Mother   . Stroke Father   . Diabetes Father   . Cancer Brother   . Heart disease Brother   . Hypertension Brother   . Diabetes Brother     Social History History  Substance Use Topics  . Smoking status: Never Smoker   . Smokeless tobacco: Never Used  . Alcohol Use: No     Comment: very little    Allergies  Allergen Reactions  . Morphine And Related     Passed out in hospital  . Xylocaine-Epinephrine [Lidocaine-Epinephrine] Other (See Comments)    faint  . Exelon [Rivastigmine]     Felt "real bad"    Current Outpatient Prescriptions  Medication Sig Dispense Refill  . aspirin 81 MG tablet Take 81 mg by mouth daily.      . carbidopa-levodopa (SINEMET IR) 25-100 MG per tablet Take 2 tablets by mouth  4 (four) times daily.  240 tablet  5  . cholecalciferol (VITAMIN D) 1000 UNITS tablet Take 2,000 Units by mouth daily.       . entacapone (COMTAN) 200 MG tablet Take 200 mg by mouth 4 (four) times daily.      . finasteride (PROSCAR) 5 MG tablet Take 1 tablet (5 mg total) by mouth daily.  90 tablet  3  . lovastatin (MEVACOR) 40 MG tablet Take 0.5 tablets (20 mg total) by mouth at bedtime.  15 tablet  0  . Polyethylene Glycol 3350 (MIRALAX PO) Take by mouth daily as needed.      Marland Kitchen RANEXA 500 MG 12 hr tablet TAKE ONE TABLET TWICE A DAY  60 tablet  11  . venlafaxine (EFFEXOR) 75 MG tablet Take 1 tablet (75 mg total) by mouth daily.  15 tablet  0   No current facility-administered medications for this visit.    Review of Systems Review of Systems  Blood pressure 110/70, pulse 80, resp. rate 12, height  (1.676 m), weight 163 lb (73.936 kg).  Physical Exam Physical Exam  Constitutional: He is oriented to person, place, and time.  He appears well-developed and well-nourished.  Genitourinary:  Hernia repair intact, minimal swelling noted.  Neurological: He is alert and oriented to person, place, and time.  Skin: Skin is warm and dry.   Minimal cord thickening along the inguinal canal, very modest considering extensive dissection.  Data Reviewed The nodular material within the inguinal canal was an infarcted angiolipoma.  Assessment    Doing well post right inguinal/scrotal hernia repair under spinal anesthesia.    Plan    The patient will call if he has any concerns, follow up otherwise will be on an as-needed basis.     PCP/Ref: Georgiann Mohs 03/27/2014, 10:44 AM

## 2014-03-30 ENCOUNTER — Inpatient Hospital Stay: Payer: Self-pay | Admitting: Internal Medicine

## 2014-03-30 LAB — COMPREHENSIVE METABOLIC PANEL
ALBUMIN: 3 g/dL — AB (ref 3.4–5.0)
AST: 18 U/L (ref 15–37)
Alkaline Phosphatase: 48 U/L
Anion Gap: 6 — ABNORMAL LOW (ref 7–16)
BILIRUBIN TOTAL: 0.4 mg/dL (ref 0.2–1.0)
BUN: 40 mg/dL — ABNORMAL HIGH (ref 7–18)
CALCIUM: 8.2 mg/dL — AB (ref 8.5–10.1)
CHLORIDE: 108 mmol/L — AB (ref 98–107)
CO2: 26 mmol/L (ref 21–32)
Creatinine: 1.05 mg/dL (ref 0.60–1.30)
EGFR (African American): >60
EGFR (Non-African Amer.): >60
Glucose: 97 mg/dL (ref 65–99)
OSMOLALITY: 289 (ref 275–301)
Potassium: 3.8 mmol/L (ref 3.5–5.1)
SGPT (ALT): <6 U/L — ABNORMAL LOW
Sodium: 140 mmol/L (ref 136–145)
Total Protein: 5.9 g/dL — ABNORMAL LOW (ref 6.4–8.2)

## 2014-03-30 LAB — URINALYSIS, COMPLETE
BLOOD: NEGATIVE
Bacteria: NONE SEEN
Bilirubin,UR: NEGATIVE
Glucose,UR: NEGATIVE mg/dL (ref 0–75)
Leukocyte Esterase: NEGATIVE
NITRITE: NEGATIVE
PH: 5 (ref 4.5–8.0)
PROTEIN: NEGATIVE
Specific Gravity: 1.02 (ref 1.003–1.030)
Squamous Epithelial: NONE SEEN
WBC UR: <1 /HPF (ref 0–5)

## 2014-03-30 LAB — CBC
HCT: 26.1 % — ABNORMAL LOW (ref 40.0–52.0)
HGB: 8.7 g/dL — ABNORMAL LOW (ref 13.0–18.0)
MCH: 31 pg (ref 26.0–34.0)
MCHC: 33.2 g/dL (ref 32.0–36.0)
MCV: 93 fL (ref 80–100)
PLATELETS: 250 10*3/uL (ref 150–440)
RBC: 2.79 10*6/uL — ABNORMAL LOW (ref 4.40–5.90)
RDW: 15 % — AB (ref 11.5–14.5)
WBC: 6.2 10*3/uL (ref 3.8–10.6)

## 2014-03-30 LAB — CK TOTAL AND CKMB (NOT AT ARMC)
CK, TOTAL: 49 U/L
CK, Total: 55 U/L
CK, Total: 46 U/L
CK-MB: 1.3 ng/mL (ref 0.5–3.6)
CK-MB: 0.9 ng/mL (ref 0.5–3.6)
CK-MB: 0.8 ng/mL (ref 0.5–3.6)

## 2014-03-30 LAB — OCCULT BLOOD X 1 CARD TO LAB, STOOL: OCCULT BLOOD, FECES: POSITIVE

## 2014-03-30 LAB — TROPONIN I
Troponin-I: <0.02 ng/mL
Troponin-I: <0.02 ng/mL

## 2014-03-31 DIAGNOSIS — R079 Chest pain, unspecified: Secondary | ICD-10-CM

## 2014-03-31 LAB — CBC WITH DIFFERENTIAL/PLATELET
Basophil #: 0.1 10*3/uL (ref 0.0–0.1)
Basophil %: 2.1 %
Eosinophil #: 0.2 10*3/uL (ref 0.0–0.7)
Eosinophil %: 3.7 %
HCT: 23.2 % — ABNORMAL LOW (ref 40.0–52.0)
HGB: 7.7 g/dL — ABNORMAL LOW (ref 13.0–18.0)
LYMPHS PCT: 25.2 %
Lymphocyte #: 1.2 10*3/uL (ref 1.0–3.6)
MCH: 31.4 pg (ref 26.0–34.0)
MCHC: 33.3 g/dL (ref 32.0–36.0)
MCV: 94 fL (ref 80–100)
Monocyte #: 0.5 x10 3/mm (ref 0.2–1.0)
Monocyte %: 9.7 %
Neutrophil #: 2.9 10*3/uL (ref 1.4–6.5)
Neutrophil %: 59.3 %
Platelet: 205 10*3/uL (ref 150–440)
RBC: 2.45 10*6/uL — AB (ref 4.40–5.90)
RDW: 14.8 % — ABNORMAL HIGH (ref 11.5–14.5)
WBC: 4.9 10*3/uL (ref 3.8–10.6)

## 2014-03-31 LAB — BASIC METABOLIC PANEL
Anion Gap: 3 — ABNORMAL LOW (ref 7–16)
BUN: 31 mg/dL — ABNORMAL HIGH (ref 7–18)
CALCIUM: 8.4 mg/dL — AB (ref 8.5–10.1)
CHLORIDE: 109 mmol/L — AB (ref 98–107)
CREATININE: 0.91 mg/dL (ref 0.60–1.30)
Co2: 28 mmol/L (ref 21–32)
EGFR (African American): >60
GLUCOSE: 97 mg/dL (ref 65–99)
OSMOLALITY: 286 (ref 275–301)
POTASSIUM: 3.7 mmol/L (ref 3.5–5.1)
Sodium: 140 mmol/L (ref 136–145)

## 2014-03-31 LAB — LIPID PANEL
CHOLESTEROL: 94 mg/dL (ref 0–200)
HDL: 30 mg/dL — AB (ref 40–60)
Ldl Cholesterol, Calc: 46 mg/dL (ref 0–100)
TRIGLYCERIDES: 91 mg/dL (ref 0–200)
VLDL Cholesterol, Calc: 18 mg/dL (ref 5–40)

## 2014-03-31 LAB — TSH: THYROID STIMULATING HORM: 3.32 u[IU]/mL

## 2014-03-31 LAB — HEMOGLOBIN: HGB: 7.5 g/dL — ABNORMAL LOW (ref 13.0–18.0)

## 2014-04-01 LAB — HEMOGLOBIN: HGB: 8.8 g/dL — ABNORMAL LOW (ref 13.0–18.0)

## 2014-04-02 DIAGNOSIS — R079 Chest pain, unspecified: Secondary | ICD-10-CM

## 2014-04-02 LAB — BASIC METABOLIC PANEL
ANION GAP: 9 (ref 7–16)
BUN: 16 mg/dL (ref 7–18)
CREATININE: 0.91 mg/dL (ref 0.60–1.30)
Calcium, Total: 8.3 mg/dL — ABNORMAL LOW (ref 8.5–10.1)
Chloride: 105 mmol/L (ref 98–107)
Co2: 26 mmol/L (ref 21–32)
EGFR (Non-African Amer.): >60
Glucose: 81 mg/dL (ref 65–99)
OSMOLALITY: 280 (ref 275–301)
POTASSIUM: 3.5 mmol/L (ref 3.5–5.1)
SODIUM: 140 mmol/L (ref 136–145)

## 2014-04-02 LAB — TROPONIN I: Troponin-I: 0.06 ng/mL — ABNORMAL HIGH

## 2014-04-02 LAB — CBC WITH DIFFERENTIAL/PLATELET
Basophil #: 0.1 10*3/uL (ref 0.0–0.1)
Basophil %: 0.8 %
Eosinophil #: 0 10*3/uL (ref 0.0–0.7)
Eosinophil %: 0.5 %
HCT: 26 % — ABNORMAL LOW (ref 40.0–52.0)
HGB: 8.8 g/dL — ABNORMAL LOW (ref 13.0–18.0)
LYMPHS ABS: 0.8 10*3/uL — AB (ref 1.0–3.6)
Lymphocyte %: 8.7 %
MCH: 32 pg (ref 26.0–34.0)
MCHC: 33.9 g/dL (ref 32.0–36.0)
MCV: 94 fL (ref 80–100)
MONO ABS: 0.8 x10 3/mm (ref 0.2–1.0)
MONOS PCT: 8.7 %
NEUTROS ABS: 7.3 10*3/uL — AB (ref 1.4–6.5)
NEUTROS PCT: 81.3 %
PLATELETS: 260 10*3/uL (ref 150–440)
RBC: 2.75 10*6/uL — AB (ref 4.40–5.90)
RDW: 15.1 % — ABNORMAL HIGH (ref 11.5–14.5)
WBC: 9 10*3/uL (ref 3.8–10.6)

## 2014-04-02 LAB — CK TOTAL AND CKMB (NOT AT ARMC)
CK, Total: 90 U/L
CK-MB: 2.6 ng/mL (ref 0.5–3.6)

## 2014-04-02 LAB — HEPATIC FUNCTION PANEL A (ARMC)
ALT: 7 U/L — AB
Albumin: 2.9 g/dL — ABNORMAL LOW (ref 3.4–5.0)
Alkaline Phosphatase: 49 U/L
BILIRUBIN DIRECT: 0.1 mg/dL (ref 0.00–0.20)
Bilirubin,Total: 0.5 mg/dL (ref 0.2–1.0)
SGOT(AST): 24 U/L (ref 15–37)
Total Protein: 6.1 g/dL — ABNORMAL LOW (ref 6.4–8.2)

## 2014-04-05 DIAGNOSIS — G3183 Dementia with Lewy bodies: Secondary | ICD-10-CM

## 2014-04-05 DIAGNOSIS — R404 Transient alteration of awareness: Secondary | ICD-10-CM

## 2014-04-05 DIAGNOSIS — I209 Angina pectoris, unspecified: Secondary | ICD-10-CM

## 2014-04-05 DIAGNOSIS — G20 Parkinson's disease: Secondary | ICD-10-CM

## 2014-04-05 DIAGNOSIS — F0280 Dementia in other diseases classified elsewhere without behavioral disturbance: Secondary | ICD-10-CM

## 2014-04-05 DIAGNOSIS — K922 Gastrointestinal hemorrhage, unspecified: Secondary | ICD-10-CM

## 2014-04-08 DIAGNOSIS — K4091 Unilateral inguinal hernia, without obstruction or gangrene, recurrent: Secondary | ICD-10-CM

## 2014-04-18 ENCOUNTER — Ambulatory Visit: Payer: Self-pay | Admitting: Hospice and Palliative Medicine

## 2014-04-23 DIAGNOSIS — G20 Parkinson's disease: Secondary | ICD-10-CM

## 2014-04-23 DIAGNOSIS — K592 Neurogenic bowel, not elsewhere classified: Secondary | ICD-10-CM

## 2014-04-23 DIAGNOSIS — F39 Unspecified mood [affective] disorder: Secondary | ICD-10-CM

## 2014-04-23 DIAGNOSIS — G3183 Dementia with Lewy bodies: Secondary | ICD-10-CM

## 2014-05-31 DIAGNOSIS — H6123 Impacted cerumen, bilateral: Secondary | ICD-10-CM

## 2014-06-02 IMAGING — CR DG CHEST 1V PORT
1 series · 1 of 1 positions shown · non-contrast
Comparison: None.

CLINICAL DATA: Altered mental status.  Syncope this morning.

EXAM:
PORTABLE CHEST - 1 VIEW

[x chest ap]
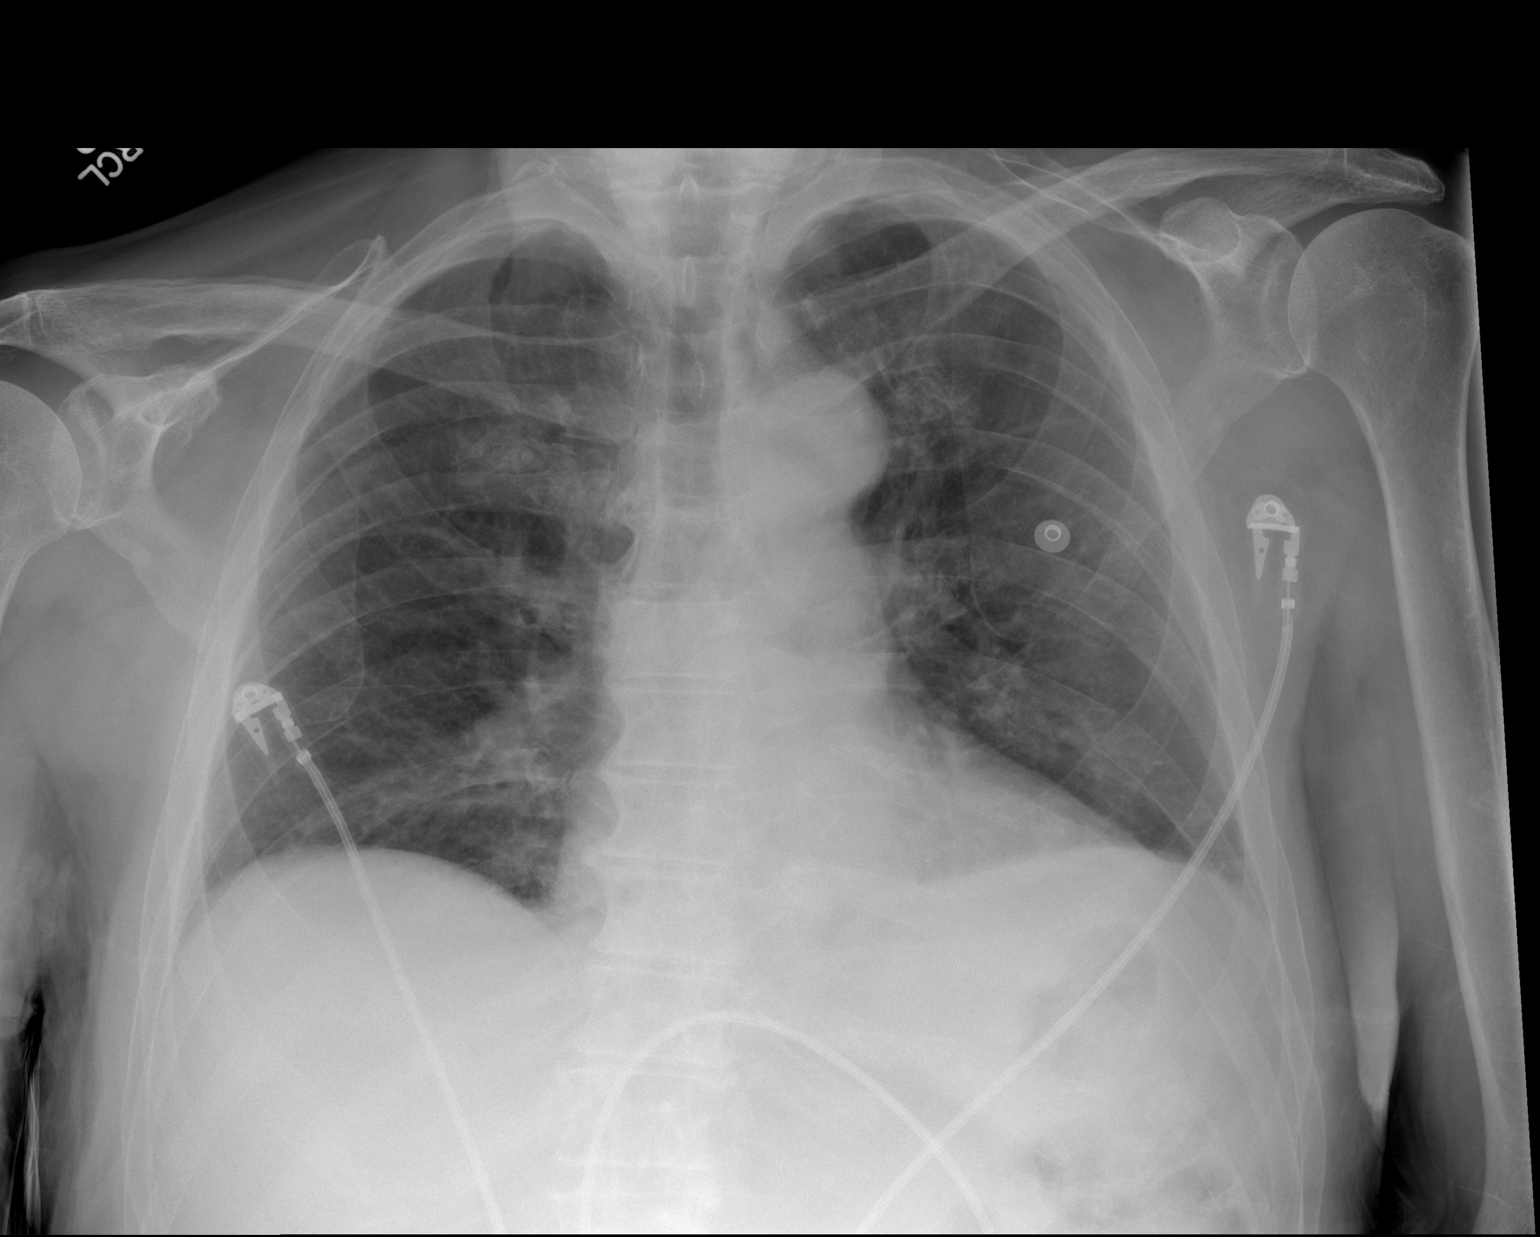

[1 of 1 positions shown; findings below may reference images not displayed]

FINDINGS: The heart size and mediastinal contours are within normal limits.
Pulmonary vascularity is within normal limits. Lung volumes are low
bilaterally. There is minimal streaky atelectasis at the left lung
base. No focal consolidations, pleural effusion, or pneumothorax.
Trachea is midline. There degenerative changes of the thoracic
spine.
IMPRESSION: Low lung volumes and minimal left basilar atelectasis.

## 2014-06-02 IMAGING — CT CT HEAD WITHOUT CONTRAST
1 series · 16 of 30 positions shown, 20 images · non-contrast
Comparison: None.

CLINICAL DATA: 84-year-old male with loss of consciousness at
breakfast. Fever. Initial encounter.

EXAM:
CT HEAD WITHOUT CONTRAST
TECHNIQUE: Contiguous axial images were obtained from the base of the skull
through the vertex without intravenous contrast.

[Series 2: soft tissue · axial · 0.40mm/px · z∈[-222,-87]mm · 16 of 30 slices shown, 20 images]
[im 2/30  brain]
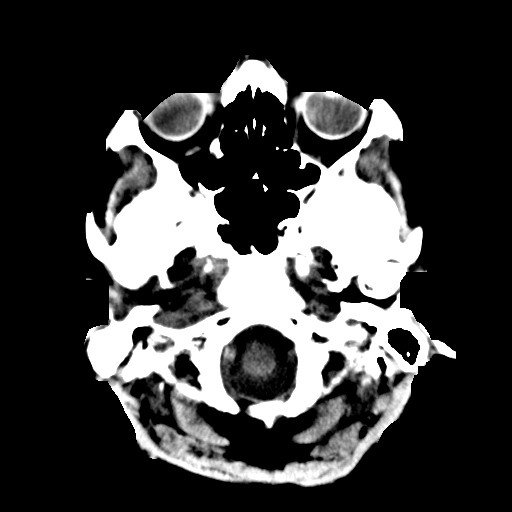
[im 2/30  bone]
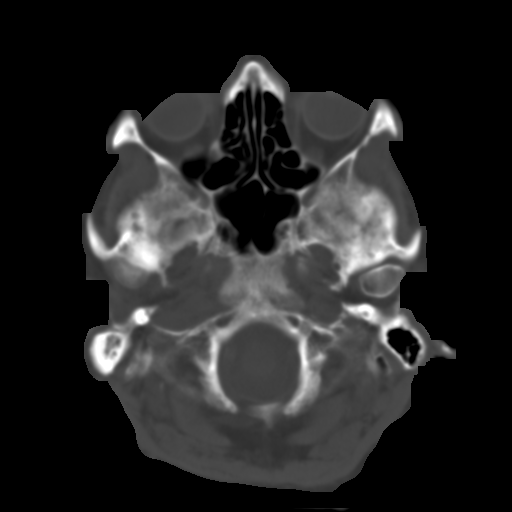
[im 4/30  brain]
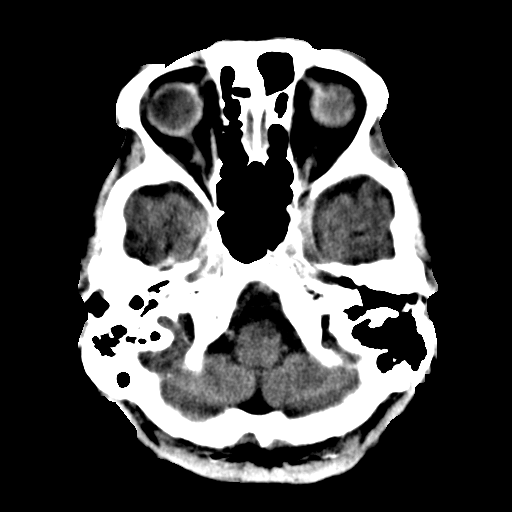
[im 6/30  brain]
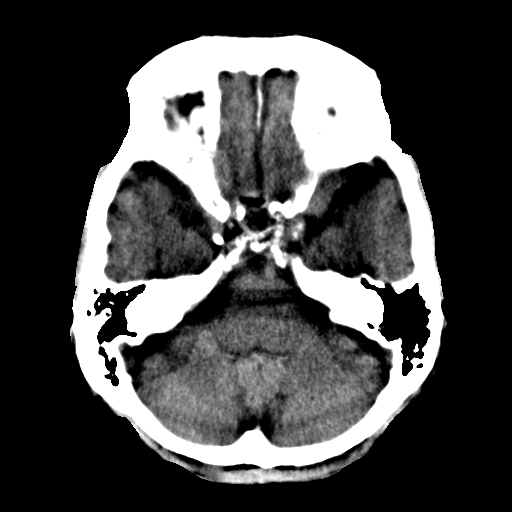
[im 8/30  brain]
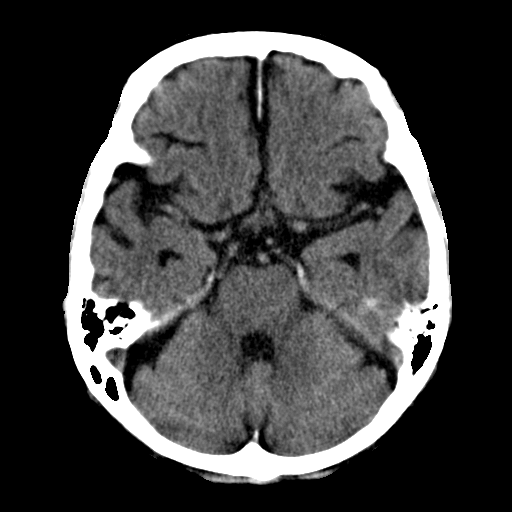
[im 9/30  brain]
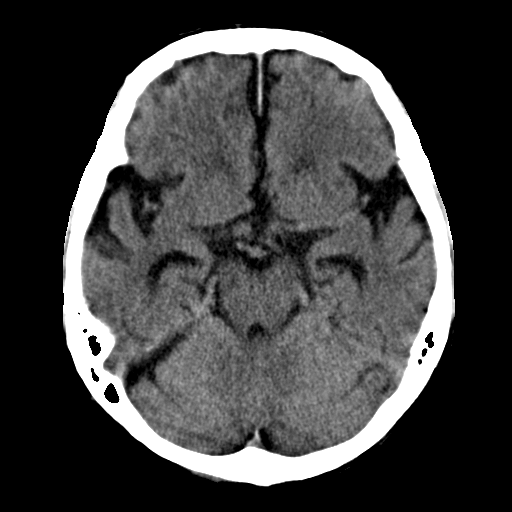
[im 9/30  bone]
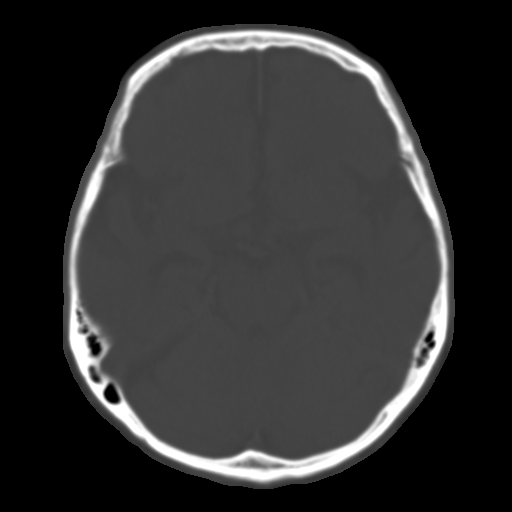
[im 11/30  brain]
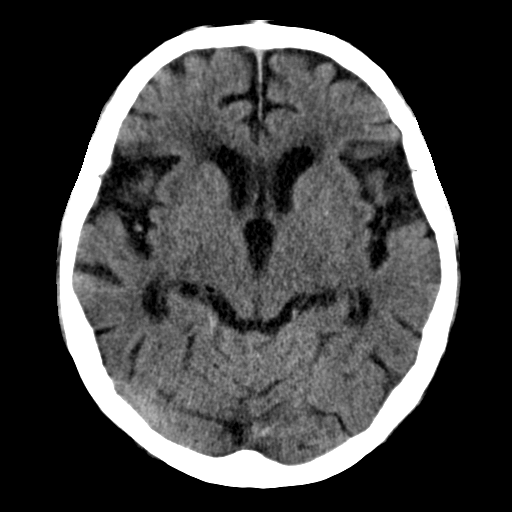
[im 13/30  brain]
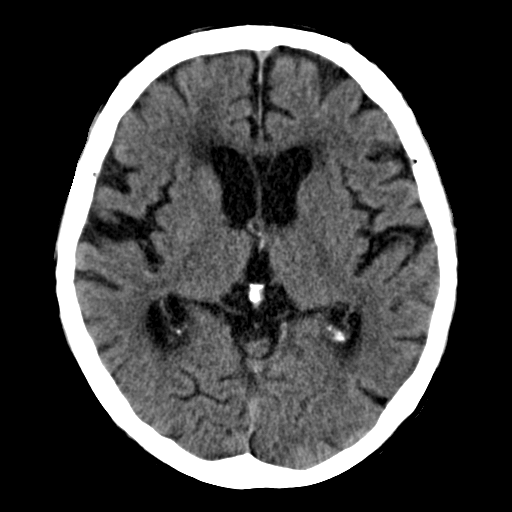
[im 15/30  brain]
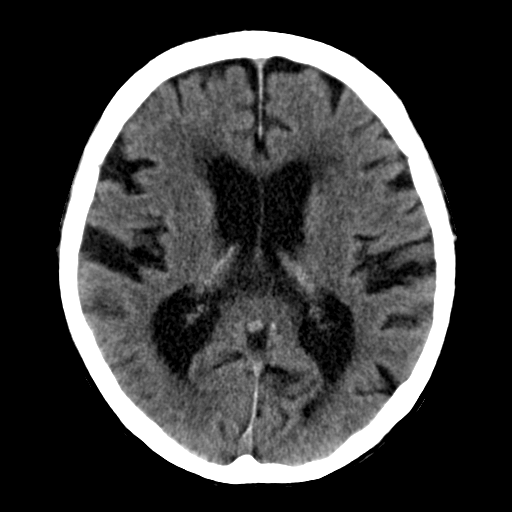
[im 16/30  brain]
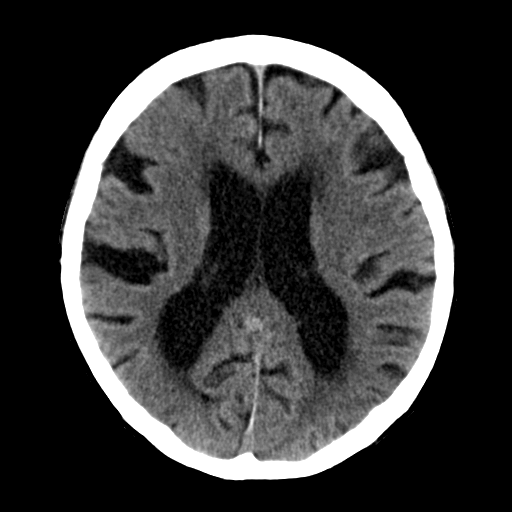
[im 16/30  bone]
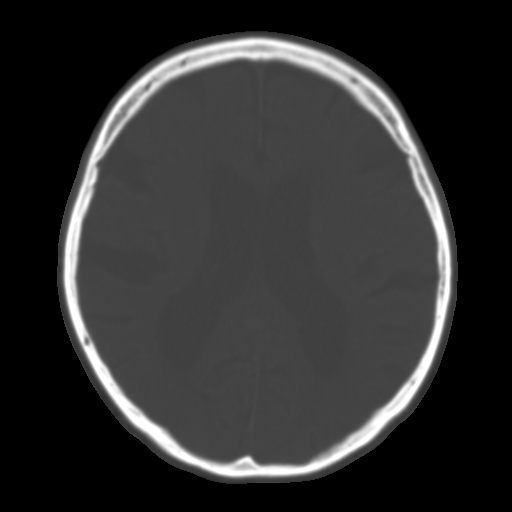
[im 18/30  brain]
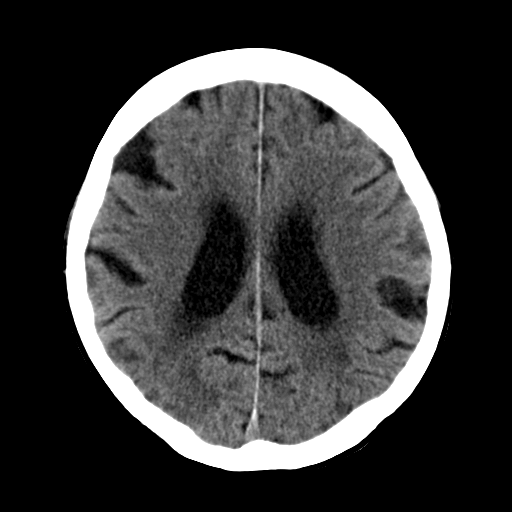
[im 20/30  brain]
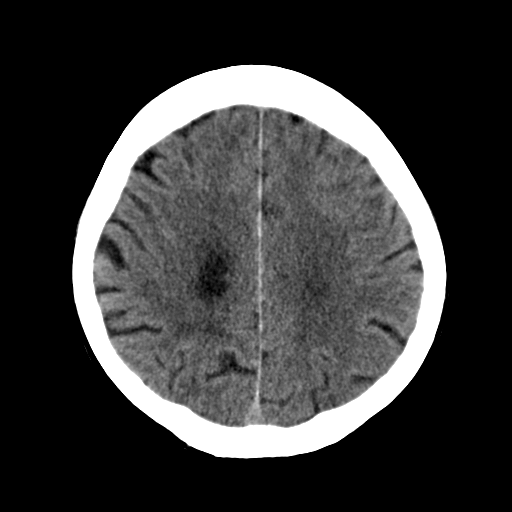
[im 22/30  brain]
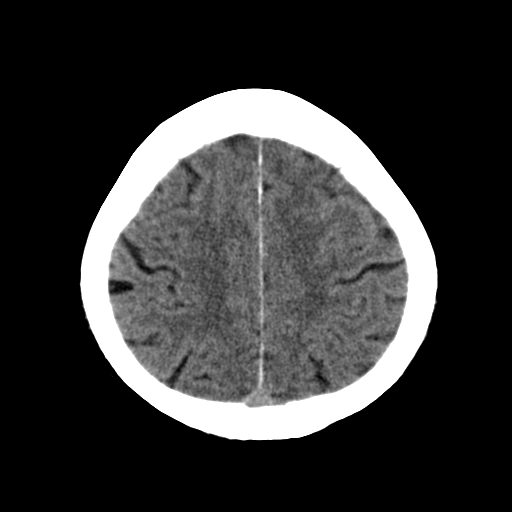
[im 23/30  brain]
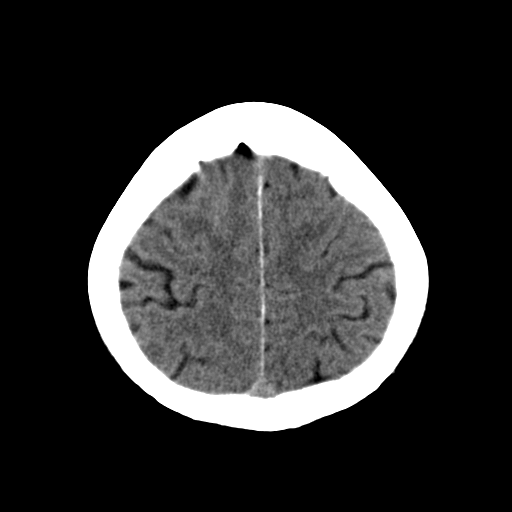
[im 23/30  bone]
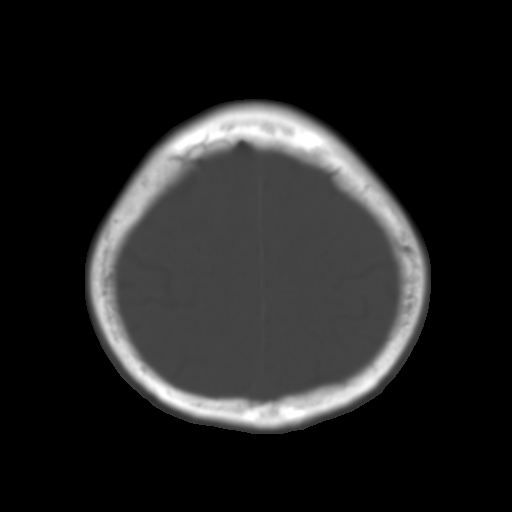
[im 25/30  brain]
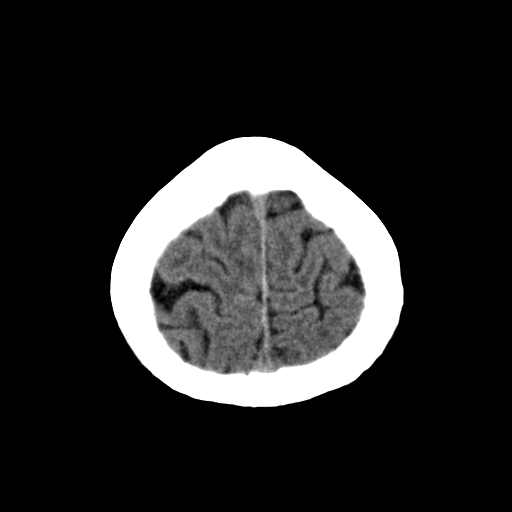
[im 27/30  brain]
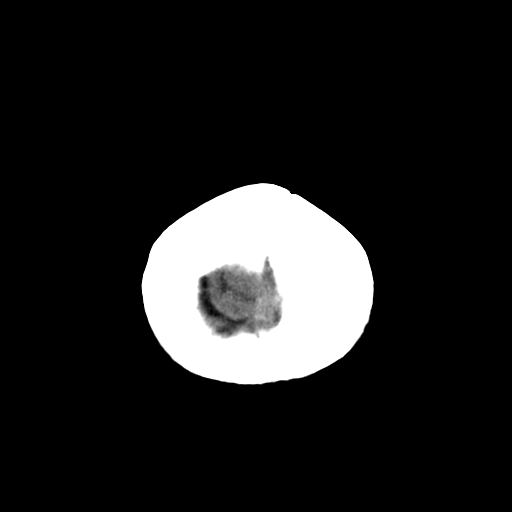
[im 29/30  brain]
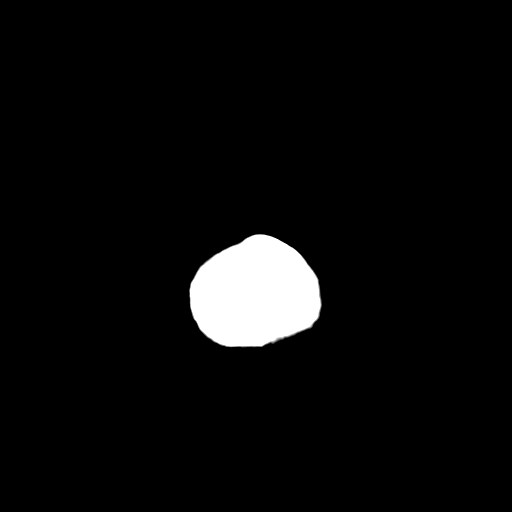

[16 of 30 positions shown; findings below may reference images not displayed]

FINDINGS: Visualized paranasal sinuses and mastoids are clear. No acute
osseous abnormality identified. No acute orbit or scalp soft tissue
abnormality.

Calcified atherosclerosis at the skull base. Cerebral volume loss,
perisylvian. Mild ex vacuo changes of the ventricles. No
ventriculomegaly. No midline shift, mass effect, or evidence of
intracranial mass lesion. Patchy bilateral cerebral white matter
hypodensity. No evidence of cortically based acute infarction
identified. No suspicious intracranial vascular hyperdensity. No
acute intracranial hemorrhage identified.
IMPRESSION: No acute intracranial abnormality. Cerebral volume loss and
nonspecific white matter changes.

## 2014-06-05 DIAGNOSIS — F323 Major depressive disorder, single episode, severe with psychotic features: Secondary | ICD-10-CM

## 2014-06-05 DIAGNOSIS — K922 Gastrointestinal hemorrhage, unspecified: Secondary | ICD-10-CM

## 2014-06-05 DIAGNOSIS — I2510 Atherosclerotic heart disease of native coronary artery without angina pectoris: Secondary | ICD-10-CM

## 2014-06-05 DIAGNOSIS — G3183 Dementia with Lewy bodies: Secondary | ICD-10-CM

## 2014-06-05 DIAGNOSIS — H6123 Impacted cerumen, bilateral: Secondary | ICD-10-CM

## 2014-06-11 ENCOUNTER — Ambulatory Visit: Payer: Medicare Other | Admitting: Internal Medicine

## 2014-06-18 DIAGNOSIS — F39 Unspecified mood [affective] disorder: Secondary | ICD-10-CM

## 2014-06-24 ENCOUNTER — Telehealth: Payer: Self-pay

## 2014-06-24 NOTE — Telephone Encounter (Signed)
PLEASE NOTE: All timestamps contained within this report are represented as [place] Standard Time. CONFIDENTIALTY NOTICE: This fax transmission is intended only for the addressee. It contains information that is legally privileged, confidential or otherwise protected from use or disclosure. If you are not the intended recipient, you are strictly prohibited from reviewing, disclosing, copying using or disseminating any of this information or taking any action in reliance on or regarding this information. If you have received this fax in error, please notify us immediately by telephone so that we can arrange for its return to us. Phone: [phone], Toll-Free: [phone], Fax: [phone] Page: 1 of 1 Call Id: [id number] [facility] Client TELEPHONE ADVICE RECORD [facility] Patient Name: [name] Rich Gender: Male DOB: 05-13-1929 Age: [age] Y 1 M 20 D Return Phone Number: Address: City/State/Zip:  Stat[facility] Site [facility] - Night Physician Tillman AbideLetvak, Richard Contact Type Call Call Type Page Only Caller Name Lily KocherBetsey Conklin RN Relationship To Patient Provider Is this call to report lab results? No Return Phone Number Unavailable Initial Comment 1ST ATTEMPT PHIL MCGOWAN Caller states she is calling [facility]. PT is being admitted to ED for possible stroke. CBN: [phone] Nurse Assessment Guidelines Guideline Title Affirmed Question Affirmed Notes Nurse Date/Time (Eastern Time) Disp. Time Lamount Cohen(Eastern Time) Disposition Final User 06/23/2014 5:02:21 PM Send to [facility] Paging Queue Nelida GoresHaynes, Erika 06/23/2014 5:06:16 PM Paged On Call back to Call Center - PC Antonietta BreachCarrasco, James 06/23/2014 5:11:14 PM Page Completed Yes Antonietta Breacharrasco, James

## 2014-06-24 NOTE — Telephone Encounter (Signed)
He was at [facility] this morning---didn't hear about this Will check again tomorrow

## 2014-06-26 DIAGNOSIS — F22 Delusional disorders: Secondary | ICD-10-CM

## 2014-06-26 DIAGNOSIS — R443 Hallucinations, unspecified: Secondary | ICD-10-CM

## 2014-07-01 ENCOUNTER — Telehealth: Payer: Self-pay | Admitting: Internal Medicine

## 2014-07-01 DIAGNOSIS — G4752 REM sleep behavior disorder: Secondary | ICD-10-CM

## 2014-07-01 NOTE — Telephone Encounter (Signed)
Spouse dropped off dnr form to be filled  Nadine said to call her to pick up or have dr Alphonsus Siasletvak bring to [facility]

## 2014-07-01 NOTE — Telephone Encounter (Signed)
Form on your desk  

## 2014-07-02 NOTE — Telephone Encounter (Signed)
Let her know that he already has a DNR order done at the health care building

## 2014-07-02 NOTE — Telephone Encounter (Addendum)
Spoke with patient's wife and advised results  

## 2014-08-02 ENCOUNTER — Telehealth: Payer: Self-pay | Admitting: *Deleted

## 2014-08-02 NOTE — Telephone Encounter (Signed)
Lm on pts vm requesting a call back if wanting to schedule flu shot. 

## 2014-09-04 DIAGNOSIS — N401 Enlarged prostate with lower urinary tract symptoms: Secondary | ICD-10-CM

## 2014-09-04 DIAGNOSIS — F39 Unspecified mood [affective] disorder: Secondary | ICD-10-CM

## 2014-09-04 DIAGNOSIS — I2510 Atherosclerotic heart disease of native coronary artery without angina pectoris: Secondary | ICD-10-CM

## 2014-09-04 DIAGNOSIS — G3183 Dementia with Lewy bodies: Secondary | ICD-10-CM

## 2014-09-04 DIAGNOSIS — G20 Parkinson's disease: Secondary | ICD-10-CM

## 2014-09-30 DIAGNOSIS — F22 Delusional disorders: Secondary | ICD-10-CM | POA: Diagnosis not present

## 2014-10-28 DIAGNOSIS — R1030 Lower abdominal pain, unspecified: Secondary | ICD-10-CM | POA: Diagnosis not present

## 2014-11-09 NOTE — Consult Note (Signed)
Chief Complaint:  Subjective/Chief Complaint The patient had a drop in the Hb overnight without any sign of active bleeding. No pain reported. Feels well.   VITAL SIGNS/ANCILLARY NOTES: **Vital Signs.:   [id number] 74:82  Diastolic BP (mmHg) Diastolic BP (mmHg) 63    70:78  Vital Signs Type Routine  Temperature Temperature (F) 97.5  Celsius 36.3  Pulse Pulse 70  Respirations Respirations 18  Systolic BP Systolic BP 90  Diastolic BP (mmHg) Diastolic BP (mmHg) 53  Mean BP 65  Pulse Ox % Pulse Ox % 97  Pulse Ox Activity Level  At rest  Oxygen Delivery Room Air/ 21 %   Brief Assessment:  GEN well developed, well nourished, no acute distress   Respiratory normal resp effort  no use of accessory muscles   Additional Physical Exam Alert and orientated times 3  Resting tremor.   Lab Results: Thyroid:  13-Sep-15 05:18   Thyroid Stimulating Hormone 3.32 (0.45-4.50 (IU = International Unit)  ----------------------- Pregnant patients have  different reference  ranges for TSH:  - - - - - - - - - -  Pregnant, first trimetser:  0.36 - 2.50 uIU/mL)  Routine Chem:  13-Sep-15 05:18   Cholesterol, Serum 94  Triglycerides, Serum 91  HDL (INHOUSE)  30  VLDL Cholesterol Calculated 18  LDL Cholesterol Calculated 46 (Result(s) reported on 31 Mar 2014 at 06:26AM.)  Glucose, Serum 97  BUN  31  Creatinine (comp) 0.91  Sodium, Serum 140  Potassium, Serum 3.7  Chloride, Serum  109  CO2, Serum 28  Calcium (Total), Serum  8.4  Anion Gap  3  Osmolality (calc) 286  eGFR (African American) >60  eGFR (Non-African American) >60 (eGFR values <76m/min/1.73 m2 may be an indication of chronic kidney disease (CKD). Calculated eGFR is useful in patients with stable renal function. The eGFR calculation will not be reliable in acutely ill patients when serum creatinine is changing rapidly. It is not useful in  patients on dialysis. The eGFR calculation may not be applicable to patients at the  low and high extremes of body sizes, pregnant women, and vegetarians.)  Routine Hem:  13-Sep-15 05:18   WBC (CBC) 4.9  RBC (CBC)  2.45  Hemoglobin (CBC)  7.7  Hematocrit (CBC)  23.2  Platelet Count (CBC) 205  MCV 94  MCH 31.4  MCHC 33.3  RDW  14.8  Neutrophil % 59.3  Lymphocyte % 25.2  Monocyte % 9.7  Eosinophil % 3.7  Basophil % 2.1  Neutrophil # 2.9  Lymphocyte # 1.2  Monocyte # 0.5  Eosinophil # 0.2  Basophil # 0.1 (Result(s) reported on 31 Mar 2014 at 06:09AM.)   Assessment/Plan:  Assessment/Plan:  Assessment Lower GI bleed.   Plan Patient to be seen by carciology for clearance.  If clear then will plan for colonoscopy by Dr. OCandace Cruisetomorrow.   Electronic Signatures: WLucilla Lame(MD)  (Signed 13-Sep-15 11:50)  Authored: Chief Complaint, VITAL SIGNS/ANCILLARY NOTES, Brief Assessment, Lab Results, Assessment/Plan   Last Updated: 13-Sep-15 11:50 by WLucilla Lame(MD)

## 2014-11-09 NOTE — Discharge Summary (Signed)
Dates of Admission and Diagnosis:  Date of Admission 30-Mar-2014   Date of Discharge 03-Apr-2014   Admitting Diagnosis chest pain   Final Diagnosis 1. Chronic blood loss anemia 2. Iron deficiency 3. Chronic angina 4. Dementia 5. Parkinsons disease    Chief Complaint/History of Present Illness PRIMARY CARE PHYSICIAN:   Viviana Simpler, MD  PRIMARY CARDIOLOGY: Serafina Royals, MD   CHIEF COMPLAINT: Chest pain and dark tarry stool.   HISTORY OF PRESENT ILLNESS: The patient is an 81 year old pleasant Caucasian male with past medical history of Parkinson disease, coronary artery disease status post PCI, is presenting to the ED with a chief complaint of chest pain.  The patient was in his usual state of health until last night.  This morning, he woke up and started having midsternal chest pain associated with shortness of breath and diaphoresis around 7:30 a.m.  He used his sublingual nitroglycerin pills with no significant improvement.  The patient is brought into the ED and he is placed on nitroglycerin patch, following which the chest pain was resolved.  The patient recently had right inguinal hernia surgery on 09/02 by Dr. Bary Castilla and his follow-up visit by Dr. Bary Castilla last week went well without any complications.  Interestingly, the patient and his wife noticed dark tarry stools for the past several days.  They did not notice any fresh blood.   In the ED, the patient's hemoglobin is at 8.7, which was at 11.7 at the time of surgery.  Stool for occult blood was positive in the ED.  A 12-lead EKG has remained normal sinus rhythm with no acute ST-T wave changes.  Hospitalist team is called to admit the patient. Typing and screening was ordered by the ED physician.   Allergies:  Morphine: Resp Arrest  Novocain: Other  Thyroid:  13-Sep-15 05:18   Thyroid Stimulating Hormone 3.32 (0.45-4.50 (IU = International Unit)  ----------------------- Pregnant patients have  different reference  ranges  for TSH:  - - - - - - - - - -  Pregnant, first trimetser:  0.36 - 2.50 uIU/mL)  Hepatic:  15-Sep-15 05:06   Bilirubin, Total 0.5  Bilirubin, Direct 0.1 (Result(s) reported on 02 Apr 2014 at 06:20AM.)  Alkaline Phosphatase 49 (46-116 NOTE: New Reference Range 02/05/14)  SGPT (ALT)  7 (14-63 NOTE: New Reference Range 02/05/14)  SGOT (AST) 24  Total Protein, Serum  6.1  Albumin, Serum  2.9  Cardiology:  13-Sep-15 09:56   Ventricular Rate 63  Atrial Rate 63  P-R Interval 230  QRS Duration 86  QT 440  QTc 450  P Axis 25  R Axis 13  T Axis 30  ECG interpretation Sinus rhythm with 1st degree A-V block Otherwise normal ECG When compared with ECG of 30-Mar-2014 09:48, No significant change was found Confirmed by Rockey Situ, TIMOTHY ([id number]) on 04/02/2014 9:03:37 AM  Overreader: Ida Rogue  15-Sep-15 05:52   Ventricular Rate 60  Atrial Rate 60  P-R Interval 206  QRS Duration 94  QT 448  QTc 448  P Axis 39  R Axis 18  T Axis 44  ECG interpretation Normal sinus rhythm Normal ECG When compared with ECG of 31-Mar-2014 09:56, No significant change was found Confirmed by Rockey Situ, TIMOTHY ([id number]) on 04/02/2014 8:52:40 AM  Overreader: Ida Rogue  Routine Chem:  13-Sep-15 05:18   Glucose, Serum 97  BUN  31  Creatinine (comp) 0.91  Sodium, Serum 140  Potassium, Serum 3.7  Chloride, Serum  109  CO2, Serum 28  Calcium (  Total), Serum  8.4  Anion Gap  3  Osmolality (calc) 286  eGFR (African American) >60  eGFR (Non-African American) >60 (eGFR values <82m/min/1.73 m2 may be an indication of chronic kidney disease (CKD). Calculated eGFR is useful in patients with stable renal function. The eGFR calculation will not be reliable in acutely ill patients when serum creatinine is changing rapidly. It is not useful in  patients on dialysis. The eGFR calculation may not be applicable to patients at the low and high extremes of body sizes, pregnant women, and vegetarians.)   Cholesterol, Serum 94  Triglycerides, Serum 91  HDL (INHOUSE)  30  VLDL Cholesterol Calculated 18  LDL Cholesterol Calculated 46 (Result(s) reported on 31 Mar 2014 at 06:26AM.)  15-Sep-15 05:06   Result Comment TROPONIN - RESULTS VERIFIED BY REPEAT TESTING.  - READ-BACK PROCESS PERFORMED.  - chris bennet 04/02/14 _0  rdj.  Result(s) reported on 02 Apr 2014 at 08:46AM.  Glucose, Serum 81  BUN 16  Creatinine (comp) 0.91  Sodium, Serum 140  Potassium, Serum 3.5  Chloride, Serum 105  CO2, Serum 26  Calcium (Total), Serum  8.3  Anion Gap 9  Osmolality (calc) 280  eGFR (African American) >60  eGFR (Non-African American) >60 (eGFR values <655mmin/1.73 m2 may be an indication of chronic kidney disease (CKD). Calculated eGFR is useful in patients with stable renal function. The eGFR calculation will not be reliable in acutely ill patients when serum creatinine is changing rapidly. It is not useful in  patients on dialysis. The eGFR calculation may not be applicable to patients at the low and high extremes of body sizes, pregnant women, and vegetarians.)  Cardiac:  15-Sep-15 05:06   Troponin I  0.06 (0.00-0.05 0.05 ng/mL or less: NEGATIVE  Repeat testing in 3-6 hrs  if clinically indicated. >0.05 ng/mL: POTENTIAL  MYOCARDIAL INJURY. Repeat  testing in 3-6 hrs if  clinically indicated. NOTE: An increase or decrease  of 30% or more on serial  testing suggests a  clinically important change)  CK, Total 90 (39-308 NOTE: NEW REFERENCE RANGE  08/20/2013)  CPK-MB, Serum 2.6 (Result(s) reported on 02 Apr 2014 at 06[facility]  Routine Hem:  13-Sep-15 05:18   WBC (CBC) 4.9  RBC (CBC)  2.45  Hemoglobin (CBC)  7.7  Hematocrit (CBC)  23.2  Platelet Count (CBC) 205  MCV 94  MCH 31.4  MCHC 33.3  RDW  14.8  Neutrophil % 59.3  Lymphocyte % 25.2  Monocyte % 9.7  Eosinophil % 3.7  Basophil % 2.1  Neutrophil # 2.9  Lymphocyte # 1.2  Monocyte # 0.5  Eosinophil # 0.2  Basophil #  0.1 (Result(s) reported on 31 Mar 2014 at 06[facility]    16:17   Hemoglobin (CBC)  7.5 (Result(s) reported on 31 Mar 2014 at 04:30PM.)  14-Sep-15 07:27   Hemoglobin (CBC)  8.8 (Result(s) reported on 01 Apr 2014 at 08:27AM.)  15-Sep-15 05:06   WBC (CBC) 9.0  RBC (CBC)  2.75  Hemoglobin (CBC)  8.8  Hematocrit (CBC)  26.0  Platelet Count (CBC) 260  MCV 94  MCH 32.0  MCHC 33.9  RDW  15.1  Neutrophil % 81.3  Lymphocyte % 8.7  Monocyte % 8.7  Eosinophil % 0.5  Basophil % 0.8  Neutrophil #  7.3  Lymphocyte #  0.8  Monocyte # 0.8  Eosinophil # 0.0  Basophil # 0.1 (Result(s) reported on 02 Apr 2014 at 05:54AM.)   PERTINENT RADIOLOGY STUDIES: XRay:    15-Sep-15 06:13, Chest  Portable Single View  Chest Portable Single View   REASON FOR EXAM:    02 sat decreased  COMMENTS:       PROCEDURE: DXR - DXR PORTABLE CHEST SINGLE VIEW  - Apr 02 2014  6:13AM     CLINICAL DATA:  Decreased O2 saturation.    EXAM:  PORTABLE CHEST - 1 VIEW    COMPARISON:  Chest radiograph from 03/30/2014    FINDINGS:  The lungs are well-aerated and clear. There is no evidence of focal  opacification, pleural effusion or pneumothorax.  The cardiomediastinal silhouette is within normal limits. No acute  osseous abnormalities are seen.     IMPRESSION:  No acute cardiopulmonary process seen.      Electronically Signed    By: Garald Balding M.D.    On: 04/02/2014 06:27         Verified By: JEFFREY . CHANG, M.D.,    16-Sep-15 08:47, Chest PA and Lateral  Chest PA and Lateral   REASON FOR EXAM:    f/u aspiration yesterday with hypoxia  COMMENTS:       PROCEDURE: DXR - DXR CHEST PA (OR AP) AND LATERAL  - Apr 03 2014  8:47AM     CLINICAL DATA:  Follow-up aspiration, hypoxia    EXAM:  CHEST  2 VIEW    COMPARISON:  04/02/2014    FINDINGS:  Cardiomediastinal silhouette is stable. No acute infiltrate or  pleural effusion. No pulmonary edema. Mild degenerative changes  thoracic spine.      IMPRESSION:  No active cardiopulmonary disease.      Electronically Signed    By: Lahoma Crocker M.D.    On: 04/03/2014 09:00         Verified By: Ephraim Hamburger, M.D.,  LabUnknown:    15-Sep-15 06:13, Chest Portable Single View  PACS Image     15-Sep-15 12:53, CT ANGIOGRAPHY Chest with for PE  PACS Image     16-Sep-15 08:47, Chest PA and Lateral  PACS Image    Pertinent Past History:  Pertinent Past History PAST MEDICAL HISTORY: Coronary artery disease status post PCI, hypertension, hyperlipidemia, Parkinson's disease, diverticulosis.   PAST SURGICAL HISTORY:  Adenoidectomy, tonsillectomy, knee surgery, back surgery, carpal tunnel surgery, cholecystectomy status post PCI in the year 2008.   Hospital Course:  Hospital Course 81 yr male with Parkinson's disease presented with chest pain. He recently had rt inguinal hernia repair by Dr.Brrnett on 9/2 and c/o dark, sticky stool for the past seveal days . FOBT was positive in ED ,Hb was 8.7 .   * Acute blood loss anemia over chronic anemia Likely lower Gi blood loss. no frank blood loss. No transfusion needed yet. Hb stable. Colonoscopy deffred at this time due to stable Hb and advanced age. F/U hb lab in 4-5 days and f/u with Dr. Candace Cruise as OP.  * Acute respiratory failure Brief episode. Aspiration? CT chest/CXR clear.  * Chest pain No ACS. poor historian  * Recent h/o rt inguinal hernia repair by de.Byrnett- healing well/ cosult sx PRN  * CAD- Stable  * Parkinsons disease Conitnue meds  * Inpatient delirium over advanced dementia  Time spent on discharge 40 minutes   Condition on Discharge Stable   Code Status:  Code Status Full Code   PHYSICAL EXAM ON DISCHARGE:  Physical Exam:  GEN thin   NECK supple   RESP normal resp effort   CARD regular rate  murmur present   ABD denies tenderness  soft  PSYCH poor insight, Confused   Additional Comments Parkinsons tremors   DISCHARGE INSTRUCTIONS HOME  MEDS:  Medication Reconciliation: Patient's Home Medications at Discharge:     Medication Instructions  entacapone 200 mg oral tablet  1 tab(s) orally 4 times a day   ranolazine 500 mg oral tablet, extended release  1 tab(s) orally 2 times a day   aspirin 81 mg oral tablet  1 tab(s) orally once a day   carbidopa-levodopa 25 mg-100 mg oral tablet  2 tab(s) orally 7 times a day   finasteride 5 mg oral tablet  1 tab(s) orally once a day   miralax - oral powder for reconstitution  17 gram(s) orally once a day   venlafaxine 75 mg oral capsule, extended release  1 cap(s) orally once a day   vitamin d3 2000 intl units oral tablet  1 tab(s) orally once a day   lovastatin 40 mg oral tablet  0.5 tab(s) orally once a day (at bedtime)   ferrous sulfate 325 mg (65 mg elemental iron) oral tablet  1 tab(s) orally 2 times a day (with meals)     Physician's Instructions:  Diet Regular   Activity Limitations As tolerated   Return to Work Not Applicable   Time frame for Follow Up Appointment Dr. Candace Cruise   Other Comments Check a Hemoglobin level in 4-5 levels. Lowest here has been 7.7. return if any blood in stool   Electronic Signatures: , Lottie Dawson (MD)  (Signed 16-Sep-15 11:49)  Authored: ADMISSION DATE AND DIAGNOSIS, CHIEF COMPLAINT/HPI, Allergies, PERTINENT LABS, PERTINENT RADIOLOGY STUDIES, PERTINENT PAST HISTORY, HOSPITAL COURSE, PHYSICAL EXAM ON DISCHARGE, DISCHARGE INSTRUCTIONS HOME MEDS, PATIENT INSTRUCTIONS   Last Updated: 16-Sep-15 11:49 by Alba Destine (MD)

## 2014-11-09 NOTE — Consult Note (Signed)
PATIENT NAME:  [name] Rich, HLAVINKA MR#:  [id number] DATE OF BIRTH:  [birth year]  DATE OF CONSULTATION:  03/30/2014  CONSULTING PHYSICIAN:  Ezzard Standing. Alyha Marines, MD  REASON FOR REFERRAL: Hematochezia with anemia and hemoccult-positive stool.   HISTORY OF PRESENT ILLNESS: The patient is an 81 year old white male with a history of moderate aortic stenosis as well as progressive Parkinson disease for the past 15 years or so. He also has hypertension and coronary artery disease. He had an inguinal hernia repair done a week- and-a-half ago. He was given some pain medication for a day or 2. His main reason for the ER visit today was severe chest pain he was experiencing this morning. There is no radiation of pain or numbness or palpitations. He recalls having a normal colonoscopy many years ago, but we do not have any records to confirm. He only takes a baby aspirin daily. I was asked to see the patient because of anemia and hemoccult-positive stool. His hemoglobin was only 8.7 and, at the time of surgery, was 11.7.   PAST MEDICAL HISTORY: Notable for coronary artery disease, hyperlipidemia, Parkinson disease.   PAST SURGICAL HISTORY: Includes tonsillectomy, knee surgery, back surgery, cholecystectomy.   ALLERGIES:  MORPHINE.   SOCIAL HISTORY:  The patient lives at home.  There is no tobacco or alcohol use.   FAMILY HISTORY: Negative.   HOME MEDICATIONS: Include vitamin D, lovastatin, finasteride, entacapone, carbidopa/levodopa, and baby aspirin.   REVIEW OF SYSTEMS: There is no change from the initial review of symptoms.   PHYSICAL EXAMINATION: GENERAL: The patient elderly man in no acute distress. He looks somewhat stiff because of his Parkinson's, but he was pretty alert and able to converse.  HEAD AND NECK: Within normal limits.  CARDIAC: Regular rhythm and rate.  LUNGS: Clear. There is a systolic ejection murmur on his cardiac examination.  ABDOMEN: Showed normoactive bowel sounds.  It was soft. It was  nontender.  EXTREMITIES: No clubbing, cyanosis, or edema.   LABORATORY DATA: Sodium 140, chloride 108, BUN 40. Creatinine is 1.05. Liver enzymes are normal. Hemoglobin is only 8.7. White count is 6.2.   ASSESSMENT AND PLAN: This is a patient with symptomatic anemia with hemoccult-positive stool. At this time, the patient is not very stable right now, but he does need a colonoscopy to find the source of the bleeding. Fortunately, because of his chest pain, I need to make sure he is cleared from a cardiology point of view before we can schedule any procedure. If cardiology clears, then I can prep the patient for tomorrow evening for a colonoscopy on Monday morning. We will wait to hear from cardiology. Thank you for the referral.   ____________________________ Ezzard Standing. Bluford Kaufmann, MD pyo:lr D: 04/01/2014 17:12:39 ET T: 04/01/2014 17:26:25 ET JOB#: [id number]  cc: Ezzard Standing. Bluford Kaufmann, MD, <Dictator> Ezzard Standing Maigen Mozingo MD ELECTRONICALLY SIGNED 04/02/2014 9:33

## 2014-11-09 NOTE — Consult Note (Signed)
Present Illness The patient is an 81 year old male with history of chronic shortness of breath which is been relatively stable over the past several months.  Patient was evaluated as an outpatient with a Lexiscan sestamibi revealing no evidence of ischemia and June, 2015. Ejection fraction was greater than 55% by echocardiogram with moderate mitral regurgitation. r.  He was admitted with chest discomfort.  He has ruled out for a myocardial infarction.  He recently underwent herniorrhaphy and tolerated this procedure well without cardiac event.  He now was admitted with some chest pain and evidence of hematochezia.  He is currently hemodynamically stable on denies chest pain.  He is relatively active without difficulty.  He also was being treated for Parkinson's disease.  This as per in followed by Neurology has been relatively stable lately.  He is status post coronary stenting in 2008. there is been no further bleeding.  His hemoglobin was 8.7 on admission. ECHO during this admission revealed preserved left ventricular function with mild mitral regurgitation.  Chest x-ray was unremarkable.  Electrocardiogram showed no ischemia.   Physical Exam:  GEN well nourished, no acute distress   HEENT PERRL   NECK supple  No masses   RESP normal resp effort  clear BS   CARD Regular rate and rhythm  Murmur   Murmur Systolic   Systolic Murmur axilla   ABD soft  normal BS   LYMPH negative neck   EXTR negative cyanosis/clubbing   SKIN normal to palpation   NEURO cranial nerves intact, motor/sensory function intact   PSYCH A+O to time, place, person   Review of Systems:  Subjective/Chief Complaint Asymptomatic at present admitted with some lower at chest and upper abdominal pain   General: No Complaints   Skin: No Complaints   ENT: No Complaints   Eyes: No Complaints   Neck: No Complaints   Respiratory: No Complaints   Cardiovascular: Chest pain or discomfort   Gastrointestinal:  Rectal Bleeding   Genitourinary: No Complaints   Vascular: No Complaints   Musculoskeletal: No Complaints   Neurologic: tremor   Hematologic: No Complaints   Endocrine: No Complaints   Psychiatric: No Complaints   Review of Systems: All other systems were reviewed and found to be negative   Medications/Allergies Reviewed Medications/Allergies reviewed   Family & Social History:  Family and Social History:  Family History Non-Contributory   EKG:  EKG NSR    Morphine: Resp Arrest  Novocain: Other   Impression 81 year old male with history of PCI in 2008 with a recent functional study in June, 2015 revealing no ischemia and preserved left ventricular function.  Echocardiogram done 6 months ago as well during this admission revealed ejection fraction of 55% with mild mitral regurgitation but was otherwise unremarkable.  Patient appears stable from a cardiac standpoint.  He recently underwent herniorrhaphy with no cardiac ramifications.  At this point he appears to be a low risk for proceeding with GI workup including sedation 32forEGD/ colonoscopy if needed.  His Parkinson's disease appears stable.   Plan 1. Continue with current medical regimen 2. Proceed with colonoscopy/EGD if needed with routine cardiac monitoring as she appears to be at low risk from a cardiac standpoint 3. would continue with ranolazine as well as statin. 4. agree with holding aspirin until GI workup is completed.   Electronic Signatures: Dalia Heading (MD)  (Signed 14-Sep-15 06:15)  Authored: General Aspect/Present Illness, History and Physical Exam, Review of System, Family & Social History, EKG , Allergies, Impression/Plan  Last Updated: 14-Sep-15 06:15 by Dalia Heading (MD)

## 2014-11-09 NOTE — Consult Note (Signed)
Pt given apple sauce with meds around 1130 this AM. Pt sometimes uses applesauce with his Parkinson's meds. Therefore, anesthesia postponed colonscopy. I am in office all this afternoon. Anesthesia not available late today. I am in [facility] tomorrow. I have asked Dr. Shelle Iron to do the colonoscopy tomorrow AM. Pt MUST NOT be given apple sauce prior to endoscopy. Will keep patient on clear liquid rest of today but NPO after MN except for meds.   Electronic Signatures: Lutricia Feil (MD)  (Signed on 14-Sep-15 12:56)  Authored  Last Updated: 14-Sep-15 12:56 by Lutricia Feil (MD)

## 2014-11-09 NOTE — Op Note (Signed)
PATIENT NAME:  [name] Rich, [name] Rich MR#:  [id number] DATE OF BIRTH:  15-Sep-1928  DATE OF PROCEDURE:  03/20/2014  DATE OF PROCEDURE: 03/20/2013  PREOPERATIVE DIAGNOSIS: Scrotal hernia.   POSTOPERATIVE DIAGNOSIS:  Scrotal hernia.  OPERATIVE PROCEDURE: Repair of right  inguinal hernia with a large Ultrapro mesh.   SURGEON: Donnalee Curry, M.D.    ANESTHESIA: Spinal under Dr. Pernell Dupre, 0.5% Marcaine plain, 30 mL local infiltration, Toradol 30 mg.   CLINICAL NOTE:  This 81 year old male has developed an increasingly symptomatic right scrotal hernia. He was felt to be a candidate for elective repair. He received Kefzol intravenously prior to the procedure.   OPERATIVE NOTE: After the establishment of an excellent spinal under the care of Dr. Pernell Dupre, the patient was placed in the supine position, and the abdomen prepped with ChloraPrep and the penis and scrotum prepped with Betadine solution after passage of a Foley catheter by the nurse. Of note, this was passed without difficulty. The wound was draped and a 6 cm skin line incision along the anticipated course of the inguinal canal was carried down through the skin and subcutaneous tissue with hemostasis achieved by electrocautery. Marcaine was infiltrated for postoperative analgesia. Scarpa's fascia was opened in the direction of its fibers. A very large indirect sac was identified and dissected free from adjacent cord structures. This went down to and through the external inguinal ring and to the proximal aspect of the scrotum. The sac was freed circumferentially and returned to the preperitoneal space. This area was then cleared and a large Ultrapro mesh placed in the preperitoneal space. The external component was smooth along the floor of the inguinal canal. This was anchored to the pubic tubercle along the inguinal ligament with interrupted 0 Surgilon sutures. The medial and superior borders were anchored to the transverse abdominis aponeurosis with similar  sutures. A lateral slit was closed with sutures around the cord. The patient had several vigorous coughs during this time, with no evidence of weakness. Toradol was placed into the wound. The external oblique was closed with a running  2-0 Vicryl. The Scarpa's fascia was closed with a running 3-0 Vicryl, and the skin closed with a running 4-0 Vicryl subcuticular suture. Benzoin, Steri-Strips, Telfa, and Tegaderm dressing was then applied.   The patient tolerated the procedure well and was taken to the recovery room in stable condition.    ____________________________ Earline Mayotte, MD jwb:nr D: 03/20/2014 19:55:28 ET T: 03/20/2014 22:36:01 ET JOB#: [id number]  cc: Earline Mayotte, MD, <Dictator> Teena Irani. Terance Hart, MD Perina Salvaggio Brion Aliment MD ELECTRONICALLY SIGNED 03/21/2014 8:42

## 2014-11-09 NOTE — Consult Note (Signed)
Brief Consult Note: Diagnosis: Pt with Parkinsons disease, known cad s/p pci, htn, hyperlipidemia and valvular heart disease admitted with weakness and syncope.   Patient was seen by consultant.   Consult note dictated.   Recommend further assessment or treatment.   Comments: 81 yo male with hitory of cad s/p pci of OM1 in 6/08, with cardiac cath in 8/10 revealing occluded rca with good collaterals, diffuse lad and diagnal disease treated medically. Echo per [facility] on 1/14 Revealed preserved lv function with ef of 55%, moderate aortic stenosis at 0.7 cm2. Pt has been having intermittant weak spells, mostly while at rest. Had a syncopoal episode while eating breakfast promtping admission. No arrythmia noted. Mild carotid disease. No obvious neurologic event. Echo done yesterday refvealed ef of 55% with moderate aortic stenosis unchanged form one year ago. Do not feel that aortic valve was the culprit for this event since pt was at rest. Arrythmia vs volume depletion may have palyed a role. WOuld follow on telemetry for arrythmia and ambulate with assistance.  Electronic Signatures: Dalia Heading (MD)  (Signed 26-Feb-15 13:41)  Authored: Brief Consult Note   Last Updated: 26-Feb-15 13:41 by Dalia Heading (MD)

## 2014-11-09 NOTE — Consult Note (Signed)
Referring Physician:  Theodoro Grist :   Primary Care Physician:  [facility], [place], [address] [place], [place] [address], [place] [phone]  Reason for Consult: Admit Date: 11-Sep-2013  Chief Complaint: dizziness and weakness  Reason for Consult: Parkinsons   History of Present Illness: History of Present Illness:   History from pt and daughter + chart review - primary neurologist Dr. Carles Collet [facility]) patient is an 81 year old Caucasian male with past medical history significant for history of Parkinson disease, Had postural and kinetic tremors since his early 29s but developed Parkinsonis since late 25s. He has anosmia, hypophonia, visual hallucinations (got better after reducing mirapex), no drooling, +ve masked faces, some chocking, left bradykinesia > right, freezing spells but no dyskinesia, constipation, usused to have vivid dreams but now mostly acts out his dreams.  was having breakfast today in the morning, and he  called to his wife. He became somewhat dizzy and very weak. His wife got a walker and was able to get him to the bedroom where he was placed in a recliner and he became unconscious, according to her description. She called EMS, and they checked his temperature, which was found to be 100.4. The patient was given some Tylenol, and on arrival to the Emergency Room, his temperature is quite okay; however, further evaluation of his fevers revealed possible pneumonia. The patient, himself, denies any sputum production or cough. He denies any significant dysphagia; however, according to the patient's family, he has been having problems with dysphagia, having trouble swallowing for the past week or so. He complained of that to his daughter. He has not been feeling well for the past 2 weeks. Apparently his Parkinson disease is getting worse, and the patient's family got a shower stall and bedside commode to help him with his disability, and they have  been working hard to help him around.  MEDICAL HISTORY:  Significant for history of gallstone pancreatitis status post cholecystectomy, history of coronary artery disease, status post stenting in 2008, history of Parkinson disease, seems to be worsening over a period of time, followed by neurologist in [place]; diverticulosis, back surgery, carpal tunnel surgery, knee surgery, hyperlipidemia, hypertension, also tonsillectomy and adenoidectomy.  SURGICAL HISTORY: Back surgery, carpal tunnel surgery and those above.   MORPHINE CAUSED HIM CODE BLUE IN THE PAST.  HISTORY: No IV drug abuse, cocaine abuse, tobacco abuse. Lives with his wife at home. Never smoked.  HISTORY: No significant medical conditions in the family in regards to cardiac disease, diabetes or heart disease.   ROS:  General fever   HEENT no complaints   Lungs cough   Cardiac no complaints   GI constipation   GU no complaints   Musculoskeletal no complaints   Extremities no complaints   Skin no complaints   Neuro tremors   Endocrine no complaints   Psych no complaints   Home Medications: Medication Instructions Last Modified Date/Time  carbidopa-levodopa 25 mg-100 mg oral tablet 2 tab(s) orally 4 times a day 25-Feb-15 07:33  entacapone 200 mg oral tablet 1 tab(s) orally 4 times a day 25-Feb-15 07:33  ranolazine 500 mg oral tablet, extended release 1 tab(s) orally 2 times a day 25-Feb-15 07:33  Senna Lax 1 tab(s) orally once a day 25-Feb-15 07:33  Vitamin D3 1000 units orally once a day 25-Feb-15 07:33  Aspir 81 81 mg oral tablet 1 tab(s) orally once a day 25-Feb-15 07:33  lovastatin 40 mg oral tablet 0.5 tab(s) orally once a  day 25-Feb-15 07:33  carbidopa-levodopa 50/250 milligram(s) orally once a day (at bedtime) 25-Feb-15 07:33  venlafaxine 75 mg cap(s) orally once a day 25-Feb-15 07:33  Advil 200 mg oral tablet 1-2 tab(s) orally every  8 hours as needed. 25-Feb-15 07:33   Allergies:  Morphine: Resp  Arrest  Novocain: Other  Vital Signs: **Vital Signs.:   25-Feb-15 13:49  Vital Signs Type Routine  Temperature Temperature (F) 98.1  Celsius 36.7  Temperature Source oral  Pulse Pulse 74  Respirations Respirations 18  Systolic BP Systolic BP 408  Diastolic BP (mmHg) Diastolic BP (mmHg) 71  Mean BP 93  Pulse Ox % Pulse Ox % 100  Pulse Ox Activity Level  At rest  Oxygen Delivery Room Air/ 21 %   EXAM: General Exam Patient looks appropriate of age, well built, nourished and appropriately groomed.   Cardiovascular Exam: S1, S2 heart sounds present, S1 systolic murmur Carotid exam revealed no bruit but seemed to have transmitted cardiac murmus on left side. Lung exam was clear to auscultation belly soft  Neurological Exam      Mental Status:      Alert,     Oriented to time, place, person and situation     Attention span and concentration seemed appropriate     Memory seemed OK     Intact naming, repetition, comprehension.       Followed 2 step commands - no dysarthria     Fund of knowledge seemed appropriate for age and health status.       Cranial Nerves:      Olfactory and vagus nerves not are examined      Visual fields were full      Pupils were equal, round and reactive to light and accommodation      Extra-ocular movements are slow and sccadic      Facial sensations are normal      Face is symmetric, some jaw tremors      Finger rub was decreased symmetric in both ears      Palate and uvular movements are normal and oral sensations are OK      Neck muscle strength and shoulder shrug is normal      Tongue protrusion and uvular elevation are normal       Motor Exam: bil UE cogwheeling, bild UE bradykinesia left > right,       Muscle strength in all extremities is 5/5. tremors in both UE and LE.       Deep Tendon Reflexes:      symmetric 1 +      Right Toes are down going,  Left Toes are down going            Sensory Exam:      Sensations were intact to light  touch in all extremities                Co-ordination:      Finger to nose was OK (mild appendicular ataxia)            Gait:      not checked with IV lines and high fall risk.  Lab Results:  Thyroid:  24-Feb-15 09:28   Thyroid Stimulating Hormone 2.22 (0.45-4.50 (International Unit)  ----------------------- Pregnant patients have  different reference  ranges for TSH:  - - - - - - - - - -  Pregnant, first trimetser:  0.36 - 2.50 uIU/mL)  LabObservation:  25-Feb-15 07:59   OBSERVATION Reason for Test  Hepatic:  25-Feb-15 06:19   Bilirubin, Total 0.5  Alkaline Phosphatase 46 (45-117 NOTE: New Reference Range 06/08/13)  SGPT (ALT)  < 6  SGOT (AST)  8  Total Protein, Serum  6.0  Albumin, Serum  3.1  Routine Micro:  24-Feb-15 09:52   Specimen Source right arm  Culture Comment NO GROWTH IN 18-24 HOURS  Result(s) reported on 12 Sep 2013 at 09:00AM.    15:19   Micro Text Report INFLUENZA A+B ANTIGENS   COMMENT                   NEGATIVE FOR INFLUENZA A (ANTIGEN ABSENT)   COMMENT                   NEGATIVE FOR INFLUENZA B (ANTIGEN ABSENT)   ANTIBIOTIC                       Routine Chem:  24-Feb-15 09:28   Hemoglobin A1c ([facility]) 5.6 (The American Diabetes Association recommends that a primary goal of therapy should be <7% and that physicians should reevaluate the treatment regimen in patients with HbA1c values consistently >8%.)  25-Feb-15 06:19   Glucose, Serum 95  BUN 15  Creatinine (comp) 1.15  Sodium, Serum 139  Potassium, Serum 3.9  Chloride, Serum 107  CO2, Serum 27  Calcium (Total), Serum  8.3  Osmolality (calc) 278  eGFR (African American) >60  eGFR (Non-African American)  59 (eGFR values <62m/min/1.73 m2 may be an indication of chronic kidney disease (CKD). Calculated eGFR is useful in patients with stable renal function. The eGFR calculation will not be reliable in acutely ill patients when serum creatinine is changing rapidly. It is not useful in   patients on dialysis. The eGFR calculation may not be applicable to patients at the low and high extremes of body sizes, pregnant women, and vegetarians.)  Anion Gap  5  Cardiac:  24-Feb-15 09:28   Troponin I < 0.02 (0.00-0.05 0.05 ng/mL or less: NEGATIVE  Repeat testing in 3-6 hrs  if clinically indicated. >0.05 ng/mL: POTENTIAL  MYOCARDIAL INJURY. Repeat  testing in 3-6 hrs if  clinically indicated. NOTE: An increase or decrease  of 30% or more on serial  testing suggests a  clinically important change)  Routine UA:  24-Feb-15 09:28   Color (UA) Yellow  Clarity (UA) Clear  Glucose (UA) Negative  Bilirubin (UA) Negative  Ketones (UA) Negative  Specific Gravity (UA) 1.006  Blood (UA) Negative  pH (UA) 7.0  Protein (UA) Negative  Nitrite (UA) Negative  Leukocyte Esterase (UA) Negative (Result(s) reported on 11 Sep 2013 at 09:54AM.)  RBC (UA) 1 /HPF  WBC (UA) NONE SEEN  Bacteria (UA) NONE SEEN  Epithelial Cells (UA) NONE SEEN  Result(s) reported on 11 Sep 2013 at 09:54AM.  Routine Hem:  24-Feb-15 09:28   Reference Accession# NA (Result(s) reported on 11 Sep 2013 at 12:39PM.)  25-Feb-15 06:19   WBC (CBC) 4.7  RBC (CBC)  4.01  Hemoglobin (CBC)  12.9  Hematocrit (CBC)  37.3  Platelet Count (CBC) 206  MCV 93  MCH 32.1  MCHC 34.6  RDW  15.3  Neutrophil % 63.4  Lymphocyte % 22.8  Monocyte % 11.7  Eosinophil % 0.8  Basophil % 1.3  Neutrophil # 3.0  Lymphocyte # 1.1  Monocyte # 0.6  Eosinophil # 0.0  Basophil # 0.1 (Result(s) reported on 12 Sep 2013 at 0[facility])   Radiology Results: [place]  24-Feb-15 12:34, US Carotid Doppler Bilateral  US Carotid Doppler Bilateral   REASON FOR EXAM:    syncope  COMMENTS:       PROCEDURE: [place]  - US CAROTID DOPPLER BILATERAL  - Sep 11 2013 12:34PM     CLINICAL DATA:  Syncope    EXAM:  BILATERAL CAROTID DUPLEX ULTRASOUND    TECHNIQUE:  Pearline Cables scale imaging, color Doppler and duplex ultrasound were  performed of bilateral  carotid and vertebral arteries in the neck.    COMPARISON:  None.  FINDINGS:  Criteria: Quantification of carotid stenosis is based on velocity  parameters that correlate the residual internal carotid diameter  with NASCET-based stenosis levels, using the diameter of the distal  internal carotid lumen as the denominator for stenosis measurement.    The following velocity measurements were obtained:    RIGHT    ICA:  81 cm/sec    CCA:  829 cm/sec    SYSTOLIC ICA/CCA RATIO:  0.6  DIASTOLIC ICA/CCA RATIO:    ECA:  85 cm/sec    LEFT    ICA:  85 cm/sec    CCA:  562 cm/sec    SYSTOLIC ICA/CCA RATIO:  0.8    DIASTOLIC ICA/CCA RATIO:    ECA:  89 cm/sec  RIGHT CAROTID ARTERY: There is focal calcified plaque in the right  bulb. Low resistance internal carotid Doppler pattern with sharp  upstroke.    RIGHT VERTEBRAL ARTERY:  Antegrade.  Normal Doppler pattern.    LEFT CAROTID ARTERY: Moderate calcified in the left carotid bulb.  Low resistance internal carotid Doppler pattern.    LEFT VERTEBRAL ARTERY:  Antegrade.  Normal Doppler pattern.     IMPRESSION:  Less than 50% stenosis in the right and left internal carotid  arteries.  Electronically Signed    By: Maryclare Bean M.D.    On: 09/11/2013 12:42         Verified By: Jamas Lav, M.D.,  CT:    24-Feb-15 09:47, CT Head Without Contrast  CT Head Without Contrast   REASON FOR EXAM:    altered mental status  COMMENTS:   May transport without cardiac monitor    PROCEDURE: CT  - CT HEAD WITHOUT CONTRAST  - Sep 11 2013  9:47AM     CLINICAL DATA:  81 year old male with loss of consciousness at  breakfast. Fever. Initial encounter.    EXAM:  CT HEAD WITHOUT CONTRAST    TECHNIQUE:  Contiguous axial images were obtained from the base of the skull  through the vertex without intravenous contrast.  COMPARISON:  None.    FINDINGS:  Visualized paranasal sinuses and mastoids are clear. No acute  osseous abnormality  identified. No acute orbit or scalp soft tissue  abnormality.    Calcified atherosclerosis at the skull base. Cerebral volume loss,  perisylvian. Mild ex vacuo changes of the ventricles. No  ventriculomegaly. No midline shift, mass effect, or evidence of  intracranial mass lesion. Patchy bilateral cerebral white matter  hypodensity. No evidence of cortically based acute infarction  identified. No suspicious intracranial vascular hyperdensity. No  acute intracranial hemorrhage identified.   IMPRESSION:  No acute intracranial abnormality. Cerebral volume loss and  nonspecific white matter changes.      Electronically Signed    By: Lars Pinks M.D.    On: 09/11/2013 09:47         Verified By: Gwenyth Bender.[place] Crane, M.D.,   Radiology Impression: Radiology Impression: CT head - cortical atrophy and WM  hypodensities (suggestive of microvascular ischemic changes)   Impression/Recommendations: Recommendations:   1) Parkinsonim in a patient with previous history of tremors suggestive of benign essential tremors.explained ideopathic parkinsons' disease - motor and non-motor symptoms effect on speech and swallowtiming of meds prolonged discussion decided to change carbidopa/levodopa (25/100) 2 pills 4 am, 7 am, 10 am, 1 pm, 4 pm, 7 pm, 10 pm.medicine can be delayed for one hour if he is sleeping.d/c 5 pm PRN dosed/c long acting dose for night time. watch out for nause, orthostasis and visual hallucinations. daughter seem to understand disease well and has been in touch with primary neurologist Dr. Leamon Arnt PT/OT syncopal spell ? - orthostasis - neg Carotid US. will follow  Electronic Signatures: Ray Church (MD)  (Signed 25-Feb-15 22:43)  Authored: REFERRING PHYSICIAN, Primary Care Physician, Consult, History of Present Illness, Review of Systems, PAST MEDICAL/SURGICAL HISTORY, HOME MEDICATIONS, ALLERGIES, NURSING VITAL SIGNS, Physical Exam-, LAB RESULTS, RADIOLOGY RESULTS,  Recommendations   Last Updated: 25-Feb-15 22:43 by Ray Church (MD)

## 2014-11-09 NOTE — Consult Note (Signed)
Pt seen and examined. Full consult to follow. Pt with moderate aortic stenosis,  progressive Parkinson's disease x 15 yrs, HTN, and CAD who presents with "dark" stools x few weeks with drop in hgb to 8 with heme positive stool. Had right inguinal hernia repair x 1 1/2 weeks ago. Given pain meds for a day or two. Main reason for ER visit today was severe chest pain he was experiencing this AM. No radiation of pain. Recalls having a normal colonoscopy many years ago. No records available to confirm. Only takes baby ASA daily. Has systolic murmur. Does need colonoscopy if patient remains anemic with heme positive stool. However, patient is not very stable now. Need clearance from cardiology to clear the patient if we are to proceed. Will have Dr. Servando Snare see patient tomorrow. If cardiology clears patient, then patient can be prepped tomorrow evening for colonoscopy on Monday AM.   Electronic Signatures: Lutricia Feil (MD)  (Signed on 12-Sep-15 13:03)  Authored  Last Updated: 12-Sep-15 13:03 by Lutricia Feil (MD)

## 2014-11-09 NOTE — H&P (Signed)
PATIENT NAME:  [name] Rich, [name] Rich MR#:  [id number] DATE OF BIRTH:  1929-04-05  DATE OF ADMISSION:  09/11/2013  PRIMARY CARE PHYSICIAN:  Dr. Alphonsus Sias.  The patient is an 81 year old Caucasian male with past medical history significant for history of Parkinson disease, who has been having problems with Parkinson's over the past few weeks with adjustment of his medications. Apparently was having breakfast today in the morning, and he  called to his wife. He became somewhat dizzy and very weak. His wife got a walker and was able to get him to the bedroom where he was placed in a recliner and he became unconscious, according to her description. She called EMS, and they checked his temperature, which was found to be 100.4. The patient was given some Tylenol, and on arrival to the Emergency Room, his temperature is quite okay; however, further evaluation of his fevers revealed possible pneumonia. The patient, himself, denies any sputum production or cough. He denies any significant dysphagia; however, according to the patient's family, he has been having problems with dysphagia, having trouble swallowing for the past week or so. He complained of that to his daughter. He has not been feeling well for the past 2 weeks. Apparently his Parkinson disease is getting worse, and the patient's family got a shower stall and bedside commode to help him with his disability, and they have been working hard to help him around.   PAST MEDICAL HISTORY:  Significant for history of gallstone pancreatitis status post cholecystectomy, history of coronary artery disease, status post stenting in 2008, history of Parkinson disease, seems to be worsening over a period of time, followed by neurologist in [place]; diverticulosis, back surgery, carpal tunnel surgery, knee surgery, hyperlipidemia, hypertension, also tonsillectomy and adenoidectomy.   MEDICATIONS: According to medical records, the patient is on aspirin 81 mg p.o. daily,  carbidopa/levodopa 25/100, 2 tablets 4 times daily; carbidopa/levodopa 25/250 disintegrating tablets once at bedtime, entacapone 200 mg 4 times daily, lovastatin 40 mg half tablet, which would be 10 mg p.o. once daily; Ranolazine 500 mg p.o. twice daily, Senna Lax 1 tablet once daily, venlafaxine 1 capsule once daily and vitamin D3, 1 tablet once daily.  Venlafaxine as well as vitamin D3 doses unknown.   PAST SURGICAL HISTORY: Back surgery, carpal tunnel surgery and those above.    ALLERGIES: MORPHINE CAUSED HIM CODE BLUE IN THE PAST.   SOCIAL HISTORY: No IV drug abuse, cocaine abuse, tobacco abuse. Lives with his wife at home. Never smoked.   FAMILY HISTORY: No significant medical conditions in the family in regards to cardiac disease, diabetes or heart disease.   REVIEW OF SYSTEMS: Not available, as the patient is poorly responsive. However, denies any chest pains, shortness of breath or abdominal pains, diarrhea or nausea.   PHYSICAL EXAMINATION:  VITAL SIGNS:  On arrival to the hospital, temperature was 99.1. Pulse was 81. Respiratory rate was 20, blood pressure 134/68. O2 saturation was 99% on room air.  GENERAL: This is a well-developed, well-nourished Caucasian male in no significant distress, comfortable on the stretcher. His eyes are closed and he is very stiff looking lying on the  stretcher. HEENT:  His pupils were equal, reactive to light Extraocular movements intact. No icterus or conjunctivitis. Has normal hearing. No pharyngeal erythema.  Mucosa is dry. NECK: No masses. Supple, nontender. Thyroid is not enlarged. No adenopathy. No JVD. The patient does have carotid bruits bilaterally. Full range of motion.  LUNGS: Clear to auscultation in all fields. No rales, rhonchi or  diminished breath sounds or wheezing. No labored inspirations, increased effort, dullness to percussion; however, a few crackles were heard anteriorly on the left.  CARDIOVASCULAR: S1, S2 appreciated. The patient  does have systolic murmur, approximately 3/6 noted in precordial area radiating to his neck. PMI not lateralized. Chest is nontender to palpation.   1+ pedal pulses. No lower extremity, calf tenderness or cyanosis was noted.  ABDOMEN: Soft, nontender. Bowel sounds are present. No hepatosplenomegaly or masses were noted.  RECTAL: Deferred.  EXTREMITIES:  Muscle strength: Able to move his extremities. No cyanosis, degenerative joint disease or kyphosis. Gait was not tested.  SKIN: Did not reveal any rashes, lesions, erythema, nodularity or induration. It was warm and dry to palpation.  LYMPHATIC: No adenopathy in the cervical region.  NEUROLOGIC: The patient is very rigid. Cogwheel rigidity was noted. Cranial nerves were intact. Sensory is difficult to obtain. The patient does have some mild dysarthria. The is somewhat somnolent, poorly oriented, as well as poorly cooperative.   LABORATORY DATA: BMP showed glucose of 107, BUN of 22, otherwise BMP was unremarkable. Liver enzymes were normal. Troponin was less than 0.02. White blood cell count was 6.2, hemoglobin 13.9, platelet count 218. Urinalysis was unremarkable.   RADIOLOGIC STUDIES: Chest x-ray, portable single view, 09/11/2013, revealed low lung volumes and minimal left basilar atelectasis, according to radiology. CT scan of head without contrast, 09/11/2013, revealed no acute intracranial abnormalities.  Cerebral volume loss  (Dictation Anomaly) with white matter changes were noted.   ASSESSMENT AND PLAN: 1.  Syncope of unclear etiology at this time, possibly some element of dehydration. We will get orthostatic vital signs and will get also echocardiogram done since the patient has significant murmur, as well as carotid ultrasound. We will continue IV fluids for now, as well as neurologically we will be evaluating him every 4 hours.  2.  Metabolic encephalopathy related to fevers, questionable infection. We will continue neuro checks, as mentioned  above. We will start the patient on antibiotic therapy and we will rehydrate him.  3.  Fever, questionable pneumonia. We will get also a flu test. We will get blood cultures and sputum cultures if possible. I will start the patient on Rocephin for possible pneumonia. We will get speech therapist evaluation to rule out aspiration.  4. Parkinson disease, seems to be worsening. We will get neurology involved for further recommendations. We will resume patient's medications whenever he is able to take p.o.    TIME SPENT:  50 minutes on this patient.    ____________________________ Katharina Caper, MD rv:dmm D: 09/11/2013 11:35:00 ET T: 09/11/2013 12:14:22 ET JOB#: [id number]  cc: Karie Schwalbe, MD Katharina Caper, MD, <Dictator> Lynett Brasil MD ELECTRONICALLY SIGNED 09/20/2013 18:52

## 2014-11-09 NOTE — Consult Note (Signed)
Chief Complaint:  Subjective/Chief Complaint Confused and combative overnight. Only drank 1/2 of bowel prep. Stool still cloudy. No bleeding.   VITAL SIGNS/ANCILLARY NOTES: **Vital Signs.:   14-Sep-15 08:34  Vital Signs Type Routine  Temperature Temperature (F) 98  Celsius 36.6  Pulse Pulse 71  Respirations Respirations 18  Systolic BP Systolic BP 135  Diastolic BP (mmHg) Diastolic BP (mmHg) 63  Mean BP 87  Pulse Ox % Pulse Ox % 99  Pulse Ox Activity Level  At rest  Oxygen Delivery Room Air/ 21 %   Brief Assessment:  GEN no acute distress   Cardiac Regular   Respiratory clear BS   Lab Results: Routine Hem:  14-Sep-15 07:27   Hemoglobin (CBC)  8.8 (Result(s) reported on 01 Apr 2014 at 08:27AM.)   Assessment/Plan:  Assessment/Plan:  Assessment Hematochezia with anemia. No further bleeding.   Plan Will give tap water enemas till clear. Give 1 dose of ativan at family's request to calm him down so he will accept enemas. Colonoscopy later this AM.   Electronic Signatures: Lutricia Feil (MD)  (Signed 14-Sep-15 10:29)  Authored: Chief Complaint, VITAL SIGNS/ANCILLARY NOTES, Brief Assessment, Lab Results, Assessment/Plan   Last Updated: 14-Sep-15 10:29 by Lutricia Feil (MD)

## 2014-11-09 NOTE — H&P (Signed)
PATIENT NAME:  [name] Rich, [name] Rich MR#:  [id number] DATE OF BIRTH:  May 05, 1929  DATE OF ADMISSION:  03/30/2014  PRIMARY CARE PHYSICIAN:   [name], MD   PRIMARY CARDIOLOGY: Arnoldo Hooker, MD   REFERRING PHYSICIAN:  Glennie Isle, DO   CHIEF COMPLAINT: Chest pain and dark tarry stool.   HISTORY OF PRESENT ILLNESS: The patient is an 81 year old pleasant Caucasian male with past medical history of Parkinson disease, coronary artery disease status post PCI, is presenting to the ED with a chief complaint of chest pain.  The patient was in his usual state of health until last night.  This morning, he woke up and started having midsternal chest pain associated with shortness of breath and diaphoresis around 7:30 a.m.  He used his sublingual nitroglycerin pills with no significant improvement.  The patient is brought into the ED and he is placed on nitroglycerin patch, following which the chest pain was resolved.  The patient recently had right inguinal hernia surgery on 09/02 by Dr. Lemar Livings and his follow-up visit by Dr. Lemar Livings last week went well without any complications.  Interestingly, the patient and his wife noticed dark tarry stools for the past several days.  They did not notice any fresh blood.   In the ED, the patient's hemoglobin is at 8.7, which was at 11.7 at the time of surgery.  Stool for occult blood was positive in the ED.  A 12-lead EKG has remained normal sinus rhythm with no acute ST-T wave changes.  Hospitalist team is called to admit the patient. Typing and screening was ordered by the ED physician.   PAST MEDICAL HISTORY: Coronary artery disease status post PCI, hypertension, hyperlipidemia, Parkinson's disease, diverticulosis.   PAST SURGICAL HISTORY:  Adenoidectomy, tonsillectomy, knee surgery, back surgery, carpal tunnel surgery, cholecystectomy status post PCI in the year 2008.   ALLERGIES:  MORPHINE CAUSED HIM CODE BLUE IN THE PAST.   PSYCHOSOCIAL HISTORY: Lives at home with  wife.  They both live with at [facility]. No history of smoking, alcohol or illicit drug usage.   FAMILY HISTORY: Denies any heart conditions, diabetes, or other significant cancer in his family.   HOME MEDICATIONS: Vitamin D 3000 International units 1 tablet p.o. once daily, venlafaxine one  capsule p.o. once daily, ranolazine 500 mg, 1 tablet p.o. b.i.d. MiraLax 17 grams p.o. once daily, lovastatin 40 mg half tablet p.o. once daily, finasteride 5 mg p.o. once daily, entacapone 200 mg 1 capsule p.o. 4 times a day carbidopa levodopa 20/100 at 2 tablets p.o. 7 times a day, aspirin 81 mg p.o. once daily.    REVIEW OF SYSTEMS:   CONSTITUTIONAL:  Denies any fever, fatigue, weakness.  EYES: Denies blurry vision, double vision.  ENT: Denies epistaxis, discharge, or tinnitus. RESPIRATION: Denies cough, chronic obstructive pulmonary disease.  CARDIOVASCULAR: Chest pain is resolved after applying nitro paste. Denies any syncope, palpitations.  GASTROINTESTINAL: Denies nausea, vomiting, diarrhea, abdominal pain no hematemesis. Complaining of dark tarry stool for the past several days. Denies any hemorrhoids.  GENITOURINARY: No dysuria, hematuria, renal colic.   ENDOCRINE:  Denies polyuria, nocturia, thyroid problems.  HEMATOLOGIC:  Denis anemia, easy bruising, bleeding.  INTEGUMENTARY: No acne, rash, lesions.  MUSCULOSKELETAL: Denies any neck pain, back pain, shoulder pain. Denies gout.  NEUROLOGIC: Denies vertigo, ataxia, CVA, transient ischemic attack.  PSYCHIATRIC: No ADD, OCD.   VITAL SIGNS: Temperature 98.4, pulse 60, respirations 18, blood pressure 108/55, pulse oximetry 97%.   PHYSICAL EXAMINATION:   GENERAL:  Not in acute  distress. Moderately built and nourished.  HEENT: Normocephalic, atraumatic. Pupils are equally reacting to light and accommodation. No scleral icterus. No conjunctival injection. No sinus tenderness. Moist mucous membranes. No postnasal drip.  NECK: Supple. No JVD. No  thyromegaly. Trachea is midline.  LUNGS: Clear to auscultation bilaterally. No accessory muscle usage noted.  No chest wall tenderness on palpation.  CARDIOVASCULAR: S1, S2 normal. Regular rate and rhythm. Positive ejection systolic murmur. No anterior chest wall tenderness on palpation.  No peripheral edema.  Distal pulses are positive in all 4 quadrants.  Nontender, nondistended. No hepatosplenomegaly. No masses.  NEUROLOGIC:  Alert and oriented x 3. Cranial nerves II through XII are grossly intact and reflexes are 2+.  Motor and sensory are intact. Resting tremors are present.  EXTREMITIES: No edema. No cyanosis. No clubbing.  SKIN: Warm to touch. Normal turgor. No rashes. No lesions.  MUSCULOSKELETAL: No joint effusion, tenderness, erythema.  PSYCHIATRIC: Flat mood and affect.   LABORATORY AND IMAGING STUDIES: A 12-lead EKG: Normal sinus rhythm with first-degree AV block. No acute ST-T wave changes. LFTs: Total protein 5.9, albumin 3.0. Rest of the LFTs are normal. First set of cardiac enzymes were normal.  Urinalysis is yellow in color, clear in appearance, nitrites are negative, leukocyte esterase negative. WBC 6.0, hemoglobin 8.7 which has dropped from 11.2 on 09/02, hematocrit 26.1, platelets are 250.  Stool for the occult blood was positive as reported by the ER physician.  Chem-8: Glucose is normal. BUN 40, creatinine is normal.  Sodium and potassium are normal.  CO2 of 26.  GFR greater than 60. Anion gap 6, serum osmolality 289, calcium 8.2. Portable chest x-ray, low lung volumes without radiographic evidence of acute cardiopulmonary disease   ASSESSMENT AND PLAN: An 81 year old Caucasian male brought into the Emergency Department with a chief complaint of chest pain started at 7:30 a.m. today and also recent history of right inguinal hernia repair by Dr. Lemar Livings.  The scar is healing well. The patient was noticing dark and sticky stool for the past several days.  Stool for occult blood is  positive in the ED, hemoglobin dropped from 11 at the time of surgery to 8.7.  1.  Chest pain, probably from acute anemia.  We will admit him to telemetry.  Hold aspirin in view of hematochezia. We will provide him beta blocker and statin. Currently, he has nitroglycerin patch. Will put a consult to Dr. Gwen Pounds, who is his primary cardiologist.   2.   Acute anemia.  Probably from recent inguinal hernia blood loss at the time of recent right inguinal hernia repair, and dark for the past few days.  We will type and screen the blood.  We will provide him with blood transfusion if hemoglobin drops to less than 8.  Gastroenterology consult is placed to Dr. Bluford Kaufmann.  3.  Recent history of right inguinal hernia repair was done by Dr. Lemar Livings and recent follow up visit was normal as reported by the patient. If necessary, we will consult surgery.  Scar is healing well.  4.  Coronary artery disease status post PCI in the past.  We will resume his home medications, including beta blocker and statin.  5.  Hyperlipidemia. Resume home medications and up titrate on an as needed basis.  6.  Family history of Parkinson's disease.  We will resume his home medication carbidopa-levodopa.  7.  We will provide gastrointestinal and deep vein thrombosis prophylaxis with SCD  CODE STATUS: He is FULL CODE. Wife is the  primary medical power of attorney.  Plan of care was discussed in detail with the patient and his daughter at bedside.  They both verbalized understanding of the plan.   TOTAL TIME SPENT:   40 minutes.    ____________________________ Ramonita Lab, MD ag:DT D: 03/30/2014 12:46:54 ET T: 03/30/2014 14:27:34 ET JOB#: [id number]  cc: Ramonita Lab, MD, <Dictator> Karie Schwalbe, MD Lamar Blinks, MD Earline Mayotte, MD  Ramonita Lab MD ELECTRONICALLY SIGNED 03/30/2014 15:56

## 2014-11-09 NOTE — Consult Note (Signed)
   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 Pt is a somewhat difficult historian. Pt and his wife state he was seated eating a meal when he becam dizzy and was helped to his bed where his wife states he lost consiousness for several minutes. He has ruled out for mi and ekg revaled no arrythmia. Echo revealed preserved lv function with moderate aortic stenosis with ava of 0.8 -0.9 cm. Echo done approximately one year ago revealed similar ef and aortic vavle disease. Pt is not very ambulatory due to his parkinsons disease.   Physical Exam:  GEN no acute distress   HEENT PERRL, hearing intact to voice   NECK supple   RESP normal resp effort  no use of accessory muscles   CARD Regular rate and rhythm  Murmur   Murmur Systolic   Systolic Murmur Out flow   ABD denies tenderness  no liver/spleen enlargement  no hernia  no Adominal Mass   LYMPH negative neck   EXTR negative cyanosis/clubbing, negative edema   SKIN normal to palpation   NEURO cranial nerves intact, motor/sensory function intact   PSYCH poor insight   Review of Systems:  Subjective/Chief Complaint syncope   General: Fatigue  Weakness   Skin: No Complaints   ENT: No Complaints   Eyes: No Complaints   Neck: No Complaints   Respiratory: Short of breath   Cardiovascular: No Complaints   Gastrointestinal: No Complaints   Genitourinary: No Complaints   Vascular: No Complaints   Musculoskeletal: No Complaints   Neurologic: No Complaints   Hematologic: No Complaints   Endocrine: No Complaints   Psychiatric: No Complaints   Review of Systems: All other systems were reviewed and found to be negative   Medications/Allergies Reviewed Medications/Allergies reviewed   EKG:  EKG NSR  nml axis    Abnormal NSSTTW changes    Morphine: Resp Arrest  Novocain: Other   Impression 81 yo male with hisogtyr of parkinsons disease who was admitted iwth a syncopal episodes. His wife states he has sypoe or near syncope on many occasions over the past several months. She states the episodes do not occur wiht exertion as he is fairly sedentary due to his neurologic disease. He has mod aortic stenosis on echo during this admission which is unchanged from his previoius echo. The syncope does not appear to be due to the aortic valve disease as they do not occur with activity. Arrythmia may be playing a role . Will need to evalaute for evidence of arrtymia. Will place on off unit telemetry and follow rhythm. Consider holter as outpatient.   Plan 1 Place on off unit tele 2. Follow on telmetry 3. Consider discharge if stabel in am with outpatinet holter 4. Conitnue current meds for parkinsons disease.   Electronic Signatures: Dalia Heading (MD)  (Signed [id number] 20:52)  Authored: General Aspect/Present Illness, History and Physical Exam, Review of System, EKG , Allergies, Impression/Plan   Last Updated: 26-Feb-15 20:52 by Dalia Heading (MD)

## 2014-11-09 NOTE — Discharge Summary (Signed)
PATIENT NAME:  [name] Rich, [name] Rich MR#:  [id number] DATE OF BIRTH:  Jul 01, 1929  DATE OF ADMISSION:  09/11/2013 DATE OF DISCHARGE:  09/14/2013  ADMITTING DIAGNOSIS:  Syncope.    DISCHARGE DIAGNOSIS:   1.  Syncope of unclear etiology. Suspected dehydration-related.  2.  Metabolic encephalopathy, resolved.  3.  Fever.  4.  Suspected bacterial pneumonia.  5.  Parkinson disease, worsening. 6.  Moderate malnutrition. 7.  Cardiac murmur.  Echocardiogram revealed moderate aortic stenosis with  valve area of 1.16 sq/cm with normal ejection fraction. 8.  History of coronary artery disease, stents in the past.   9.  History of hypertension. 10.  Hyperlipidemia. 11.  Also history of gallstone pancreatitis in the past.   12.  Diverticulosis. 13.  Back surgery. 14.  Carpal tunnel surgery.  15.  Knee surgery. 16.  Tonsillectomy. 17.  Adenoidectomy.    DISCHARGE CONDITION: Stable.   DISCHARGE MEDICATIONS:  1.  The patient is to continue entacapone 200 mg 4 times daily. 2.  Ranolazine 500 mg twice daily. 3.  Vitamin D3 1000 units once daily. 4.  Aspirin 81 mg p.o. daily. 5.  Lovastatin 20 mg p.o. daily. 6.  Venlafaxine 75 mg p.o. once daily. 7.   Entacapone  200 mg 1 tablet every 8 hours as needed. 8.  Carbidopa/levodopa 25/100, two tablets 7 times daily at 4 a.m., 7 a.m., 10 a.m., 1 p.m., 4 p.m., 7 p.m. as well as 10 p.m. 9.  Carbidopa, levodopa 25/100, 2 tablets once a day at 5:00 a.m. as needed. 10.  Senna 1 tablet twice daily as needed. 11.  Docusate sodium 1 capsule twice daily as needed.  12.  Polyethylene glycol 17 grams once daily as needed.  13.  Zithromax 250 mg p.o. daily for 2 more days.  14.  Ensure 240 mg twice daily.   HOME OXYGEN: None.   DIET: 2 grams salt, low-fat, low-cholesterol. Diet supplements of Ensure twice a day, regular consistency. The patient was advised to have swallowing precautions.   ACTIVITY LIMITATIONS: As tolerated.   REFERRALS: To physical therapy,  occupational therapy as well as speech therapy. Followup appointment with Dr. Alphonsus Sias in 2 days after discharge.   CONSULTANTS: Care management, social work, speech therapy, Dr. Sherryll Burger, neurology, Dr. Lady Gary, cardiology.   RADIOLOGIC STUDIES: Chest x-ray, portable single view, the 24th of February 2015, revealed low lung volumes, minimal left basilar atelectasis. CT scan of head without contrast, the 24th of February 2015 revealed no acute intracranial abnormality, cerebral volume loss and nonspecific white matter changes were noted. Ultrasound of carotid arteries the 24th of February 2015, revealed less than 50% stenosis in the right and left internal carotid arteries. Echocardiogram, 25th of February 2015, showed left ventricular ejection fraction by visual estimation 55% to 60%, normal global left ventricular systolic function, mildly dilated left atrium as well as right atrium, mild mitral valve regurgitation, moderate aortic valve stenosis with valve area of 1.16 sq cm, severely increased left ventricular posterior wall thickness and mild tricuspid regurgitation.  Repeated chest x-ray PA and lateral, the 27th of February 2015, showed no active cardiopulmonary disease.   HOSPITAL COURSE:  The patient is an 81 year old Caucasian male with history of Parkinson disease, who presented to the hospital on the 24th of February 2015 with complaints of syncopal episode. Apparently, the patient was dizzy, very weak and he was found to have fevers to 100.4. He was brought to the hospital where he was noted to have some atelectasis and questionable pneumonia. He  was admitted to the hospital for further evaluation and treatment. His vitals were stable in the hospital with temperature of 99.1, pulse 81, respiratory rate was 20, blood pressure 134/68. O2 sats were 99% on room air. Physical examination was unremarkable.  A few crackles were heart anteriorly on the left but otherwise unremarkable. The patient's lab data done on  admission, the 24th of February 2015, revealed mild elevation of BUN to 22, glucose 107, otherwise BMP was normal. Hemoglobin A1c was 5.6. Liver enzymes were normal. Cardiac enzymes x 1 less than 0.02. TSH was normal at 2.22. White blood cell count was normal at 6.2, hemoglobin was 13.9, platelet count was 218, absolute neutrophil count was normal at 4.5. Blood cultures x 2 did not show any growth taken on the 24th of February 2015. Influenza test was negative. Urinalysis was unremarkable. EKG showed sinus rhythm at 67 beats per minute, first degree AV block. Otherwise normal EKG. The patient was admitted to the hospital for further evaluation. He was started on therapy with antibiotics for possible pneumonia and his condition improved. He was given IV fluids and was evaluated by physical therapist.  With this, his condition significantly improved. First, in regards to syncope. The patient underwent carotid ultrasound, as well as echocardiogram and Cardiology consultation was requested for aortic valvular disease; however, the cardiologist did not feel that valvular disease was a culprit of the patient's syncopal episodes as well as weakness. He, however, recommended to follow up with Dr. Gwen Pounds in the next 1 to 2 weeks after discharge. Dr. Lady Gary felt that the patient's aortic valve area remained stable from prior echocardiograms done in the past. The patient was also evaluated by neurologist, Dr. Sherryll Burger and his Parkinson medications were adjusted.  Dr. Sherryll Burger recommended carbidopa-levodopa 25/100, two pills 7 times a day at 4 a.m., 7 a.m., 10 a.m., 1 p.m., 4 p.m., 7 p.m. as well as 10 p.m. He recommended watch out for nausea, orthostasis or visual hallucinations as well as continue aggressive physical therapy and occupational therapy. He discussed the candidacy for deep brain stimulator; however, it was felt that the patient was not a good candidate for that due to age, as well as questionable gait impairment, as well  as no real motor fluctuations or predictably on and off phenomenon. He discussed about levodopa infusion therapy with the patient and gave reference to website. In regards to her syncopal state, it was felt he was not sure why the patient developed an episode of syncopal spell; however, he felt that this could be orthostasis related. The patient's orthostatic vital signs were checked; however, those were negative for any orthostatic tilting . So in view of syncope, it was unclear why the patient syncopized however, dehydration was very likely playing some role since the patient's BUN was found to be somewhat elevated. In regards to metabolic encephalopathy, the patient's confusion as well as dizziness was likely related to fever, which was suspected due to bacterial pneumonia. In regards to bacterial pneumonia, the patient is to continue antibiotic therapy for 2 more days. For Parkinson disease, as mentioned above, the patient is to continue adjustment of medications and aggressive physical therapy, occupational therapy as well as speech therapy. In regards to moderate malnutrition, the patient was advised to continue Ensure twice a day. For cardiac murmur, the patient was noted to have moderate aortic stenosis with valve area of 1.16 sq cm; however, that did not seem to be changing significantly since prior echocardiogram and no telemetry abnormalities were  found was normal ejection fraction. No further interventions were recommended by cardiologist.  For history of coronary artery disease, hypertension as well as hyperlipidemia. No interventions and the patient is to continue his outpatient management. The patient is being discharged in stable condition with the above-mentioned medications and follow-up. His vital signs on the day of discharge, temperature was 97.6, pulse 83, respiratory rate was 18, blood pressure 115/69, saturation was 96% on room air at rest.   TIME SPENT: 40 minutes.      ____________________________ Katharina Caper, MD rv:dp D: 09/14/2013 14:08:07 ET T: 09/14/2013 14:53:33 ET JOB#: [id number]  cc: Katharina Caper, MD, <Dictator> Karie Schwalbe, MD Daylin Eads Winona Legato MD ELECTRONICALLY SIGNED 09/20/2013 18:55

## 2014-12-27 DIAGNOSIS — G3183 Dementia with Lewy bodies: Secondary | ICD-10-CM

## 2014-12-27 DIAGNOSIS — I2510 Atherosclerotic heart disease of native coronary artery without angina pectoris: Secondary | ICD-10-CM | POA: Diagnosis not present

## 2014-12-27 DIAGNOSIS — N400 Enlarged prostate without lower urinary tract symptoms: Secondary | ICD-10-CM | POA: Diagnosis not present

## 2014-12-27 DIAGNOSIS — F39 Unspecified mood [affective] disorder: Secondary | ICD-10-CM

## 2014-12-27 DIAGNOSIS — K219 Gastro-esophageal reflux disease without esophagitis: Secondary | ICD-10-CM | POA: Diagnosis not present

## 2014-12-27 DIAGNOSIS — G20 Parkinson's disease: Secondary | ICD-10-CM | POA: Diagnosis not present

## 2015-01-03 ENCOUNTER — Emergency Department: Payer: Medicare Other

## 2015-01-03 ENCOUNTER — Inpatient Hospital Stay: Payer: Medicare Other | Admitting: Anesthesiology

## 2015-01-03 ENCOUNTER — Telehealth: Payer: Self-pay

## 2015-01-03 ENCOUNTER — Encounter: Payer: Self-pay | Admitting: Emergency Medicine

## 2015-01-03 ENCOUNTER — Inpatient Hospital Stay
Admit: 2015-01-03 | Discharge: 2015-01-03 | Disposition: A | Discharge status: Home / Self Care | Payer: Medicare Other | Attending: Cardiology | Admitting: Cardiology

## 2015-01-03 ENCOUNTER — Inpatient Hospital Stay
Admission: EM | Admit: 2015-01-03 | Discharge: 2015-01-07 | DRG: 470 | Disposition: A | Discharge status: Skilled Nursing Facility | Payer: Medicare Other | Attending: Internal Medicine | Admitting: Internal Medicine

## 2015-01-03 ENCOUNTER — Encounter
Admission: EM | Disposition: A | Discharge status: Skilled Nursing Facility | Payer: Self-pay | Source: Home / Self Care | Attending: Internal Medicine

## 2015-01-03 DIAGNOSIS — Z9049 Acquired absence of other specified parts of digestive tract: Secondary | ICD-10-CM | POA: Diagnosis present

## 2015-01-03 DIAGNOSIS — I2510 Atherosclerotic heart disease of native coronary artery without angina pectoris: Secondary | ICD-10-CM | POA: Diagnosis present

## 2015-01-03 DIAGNOSIS — D62 Acute posthemorrhagic anemia: Secondary | ICD-10-CM | POA: Diagnosis present

## 2015-01-03 DIAGNOSIS — Z79899 Other long term (current) drug therapy: Secondary | ICD-10-CM

## 2015-01-03 DIAGNOSIS — I080 Rheumatic disorders of both mitral and aortic valves: Secondary | ICD-10-CM | POA: Diagnosis present

## 2015-01-03 DIAGNOSIS — Z96649 Presence of unspecified artificial hip joint: Secondary | ICD-10-CM

## 2015-01-03 DIAGNOSIS — G20 Parkinson's disease: Secondary | ICD-10-CM | POA: Diagnosis present

## 2015-01-03 DIAGNOSIS — Z66 Do not resuscitate: Secondary | ICD-10-CM | POA: Diagnosis present

## 2015-01-03 DIAGNOSIS — F329 Major depressive disorder, single episode, unspecified: Secondary | ICD-10-CM | POA: Diagnosis present

## 2015-01-03 DIAGNOSIS — Y92129 Unspecified place in nursing home as the place of occurrence of the external cause: Secondary | ICD-10-CM

## 2015-01-03 DIAGNOSIS — Z885 Allergy status to narcotic agent status: Secondary | ICD-10-CM | POA: Diagnosis not present

## 2015-01-03 DIAGNOSIS — B9562 Methicillin resistant Staphylococcus aureus infection as the cause of diseases classified elsewhere: Secondary | ICD-10-CM | POA: Diagnosis present

## 2015-01-03 DIAGNOSIS — Z955 Presence of coronary angioplasty implant and graft: Secondary | ICD-10-CM

## 2015-01-03 DIAGNOSIS — R400 Somnolence: Secondary | ICD-10-CM | POA: Diagnosis present

## 2015-01-03 DIAGNOSIS — S72002A Fracture of unspecified part of neck of left femur, initial encounter for closed fracture: Secondary | ICD-10-CM

## 2015-01-03 DIAGNOSIS — R011 Cardiac murmur, unspecified: Secondary | ICD-10-CM | POA: Diagnosis present

## 2015-01-03 DIAGNOSIS — Z7982 Long term (current) use of aspirin: Secondary | ICD-10-CM | POA: Diagnosis not present

## 2015-01-03 DIAGNOSIS — W06XXXA Fall from bed, initial encounter: Secondary | ICD-10-CM | POA: Diagnosis present

## 2015-01-03 DIAGNOSIS — S7292XA Unspecified fracture of left femur, initial encounter for closed fracture: Secondary | ICD-10-CM | POA: Diagnosis present

## 2015-01-03 DIAGNOSIS — I350 Nonrheumatic aortic (valve) stenosis: Secondary | ICD-10-CM | POA: Diagnosis present

## 2015-01-03 DIAGNOSIS — Z809 Family history of malignant neoplasm, unspecified: Secondary | ICD-10-CM

## 2015-01-03 DIAGNOSIS — S72012A Unspecified intracapsular fracture of left femur, initial encounter for closed fracture: Secondary | ICD-10-CM | POA: Diagnosis present

## 2015-01-03 DIAGNOSIS — Z8249 Family history of ischemic heart disease and other diseases of the circulatory system: Secondary | ICD-10-CM

## 2015-01-03 DIAGNOSIS — Z789 Other specified health status: Secondary | ICD-10-CM | POA: Diagnosis not present

## 2015-01-03 DIAGNOSIS — Z888 Allergy status to other drugs, medicaments and biological substances status: Secondary | ICD-10-CM | POA: Diagnosis not present

## 2015-01-03 DIAGNOSIS — Z9841 Cataract extraction status, right eye: Secondary | ICD-10-CM

## 2015-01-03 DIAGNOSIS — M19079 Primary osteoarthritis, unspecified ankle and foot: Secondary | ICD-10-CM | POA: Diagnosis present

## 2015-01-03 DIAGNOSIS — Z833 Family history of diabetes mellitus: Secondary | ICD-10-CM | POA: Diagnosis not present

## 2015-01-03 DIAGNOSIS — E0781 Sick-euthyroid syndrome: Secondary | ICD-10-CM | POA: Diagnosis present

## 2015-01-03 DIAGNOSIS — Z9889 Other specified postprocedural states: Secondary | ICD-10-CM | POA: Diagnosis not present

## 2015-01-03 DIAGNOSIS — M25559 Pain in unspecified hip: Secondary | ICD-10-CM

## 2015-01-03 DIAGNOSIS — Z96652 Presence of left artificial knee joint: Secondary | ICD-10-CM | POA: Diagnosis present

## 2015-01-03 DIAGNOSIS — Z823 Family history of stroke: Secondary | ICD-10-CM

## 2015-01-03 DIAGNOSIS — E785 Hyperlipidemia, unspecified: Secondary | ICD-10-CM | POA: Diagnosis present

## 2015-01-03 DIAGNOSIS — R739 Hyperglycemia, unspecified: Secondary | ICD-10-CM | POA: Diagnosis present

## 2015-01-03 DIAGNOSIS — M810 Age-related osteoporosis without current pathological fracture: Secondary | ICD-10-CM | POA: Diagnosis present

## 2015-01-03 HISTORY — PX: HIP ARTHROPLASTY: SHX981

## 2015-01-03 LAB — APTT: aPTT: 28 seconds (ref 24–36)

## 2015-01-03 LAB — BASIC METABOLIC PANEL
Anion gap: 5 (ref 5–15)
BUN: 23 mg/dL — ABNORMAL HIGH (ref 6–20)
CHLORIDE: 102 mmol/L (ref 101–111)
CO2: 32 mmol/L (ref 22–32)
Calcium: 9.2 mg/dL (ref 8.9–10.3)
Creatinine, Ser: 1.13 mg/dL (ref 0.61–1.24)
GFR calc Af Amer: >60 mL/min (ref >60)
GFR, EST NON AFRICAN AMERICAN: 58 mL/min — AB (ref >60)
GLUCOSE: 112 mg/dL — AB (ref 65–99)
POTASSIUM: 3.9 mmol/L (ref 3.5–5.1)
SODIUM: 139 mmol/L (ref 135–145)

## 2015-01-03 LAB — CBC
HCT: 38.2 % — ABNORMAL LOW (ref 40.0–52.0)
HEMOGLOBIN: 12.5 g/dL — AB (ref 13.0–18.0)
MCH: 30.9 pg (ref 26.0–34.0)
MCHC: 32.8 g/dL (ref 32.0–36.0)
MCV: 94.2 fL (ref 80.0–100.0)
Platelets: 245 10*3/uL (ref 150–440)
RBC: 4.06 MIL/uL — AB (ref 4.40–5.90)
RDW: 14.4 % (ref 11.5–14.5)
WBC: 6.4 10*3/uL (ref 3.8–10.6)

## 2015-01-03 LAB — ABO/RH: ABO/RH(D): O NEG

## 2015-01-03 LAB — TROPONIN I: Troponin I: <0.03 ng/mL (ref <0.031)

## 2015-01-03 LAB — SURGICAL PCR SCREEN
MRSA, PCR: POSITIVE — AB
Staphylococcus aureus: POSITIVE — AB

## 2015-01-03 LAB — TSH: TSH: 9.427 u[IU]/mL — ABNORMAL HIGH (ref 0.350–4.500)

## 2015-01-03 LAB — PROTIME-INR
INR: 1.04
PROTHROMBIN TIME: 13.8 s (ref 11.4–15.0)

## 2015-01-03 SURGERY — HEMIARTHROPLASTY, HIP, DIRECT ANTERIOR APPROACH, FOR FRACTURE
Anesthesia: General | Site: Hip | Laterality: Left | Wound class: Clean

## 2015-01-03 MED ORDER — VANCOMYCIN HCL IN DEXTROSE 1-5 GM/200ML-% IV SOLN
1000.0000 mg | Freq: Once | INTRAVENOUS | Status: DC
  Filled 2015-01-03: qty 200

## 2015-01-03 MED ORDER — SODIUM CHLORIDE 0.9 % IV SOLN
10000.0000 ug | INTRAVENOUS | Status: DC | PRN
  Administered 2015-01-03: 40 ug/min via INTRAVENOUS

## 2015-01-03 MED ORDER — POLYETHYLENE GLYCOL 3350 17 G PO PACK: 17.0000 g | PACK | Freq: Every day | ORAL | Status: DC | PRN

## 2015-01-03 MED ORDER — CARBIDOPA-LEVODOPA 25-100 MG PO TABS
2.0000 | ORAL_TABLET | Freq: Once | ORAL | Status: AC
  Administered 2015-01-03: 2 via ORAL
  Filled 2015-01-03: qty 2

## 2015-01-03 MED ORDER — SODIUM CHLORIDE 0.9 % IV SOLN
INTRAVENOUS | Status: DC
  Administered 2015-01-03 – 2015-01-04 (×2): via INTRAVENOUS

## 2015-01-03 MED ORDER — HALOPERIDOL LACTATE 5 MG/ML IJ SOLN
1.0000 mg | Freq: Four times a day (QID) | INTRAMUSCULAR | Status: DC | PRN
  Administered 2015-01-04 – 2015-01-05 (×2): 1 mg via INTRAVENOUS
  Filled 2015-01-03 (×2): qty 1

## 2015-01-03 MED ORDER — PHENYLEPHRINE HCL 10 MG/ML IJ SOLN
INTRAMUSCULAR | Status: DC | PRN
  Administered 2015-01-03: 200 ug via INTRAVENOUS

## 2015-01-03 MED ORDER — MEPERIDINE HCL 25 MG/ML IJ SOLN: 50.0000 mg | Freq: Once | INTRAMUSCULAR | Status: DC

## 2015-01-03 MED ORDER — MUPIROCIN 2 % EX OINT
1.0000 "application " | TOPICAL_OINTMENT | Freq: Two times a day (BID) | CUTANEOUS | Status: DC
  Administered 2015-01-03 – 2015-01-07 (×8): 1 via NASAL
  Filled 2015-01-03: qty 22

## 2015-01-03 MED ORDER — RANOLAZINE ER 500 MG PO TB12
500.0000 mg | ORAL_TABLET | Freq: Two times a day (BID) | ORAL | Status: DC
  Administered 2015-01-03 – 2015-01-07 (×6): 500 mg via ORAL
  Filled 2015-01-03 (×9): qty 1

## 2015-01-03 MED ORDER — ETOMIDATE 2 MG/ML IV SOLN
INTRAVENOUS | Status: DC | PRN
  Administered 2015-01-03: 14 mg via INTRAVENOUS

## 2015-01-03 MED ORDER — TRAMADOL HCL 50 MG PO TABS: 50.0000 mg | ORAL_TABLET | ORAL | Status: DC | PRN

## 2015-01-03 MED ORDER — FENTANYL CITRATE (PF) 100 MCG/2ML IJ SOLN
INTRAMUSCULAR | Status: DC | PRN
  Administered 2015-01-03 (×5): 50 ug via INTRAVENOUS

## 2015-01-03 MED ORDER — GLYCOPYRROLATE 0.2 MG/ML IJ SOLN
INTRAMUSCULAR | Status: DC | PRN
  Administered 2015-01-03: .5 mg via INTRAVENOUS

## 2015-01-03 MED ORDER — VENLAFAXINE HCL 75 MG PO TABS: 75.0000 mg | ORAL_TABLET | Freq: Every day | ORAL | Status: DC

## 2015-01-03 MED ORDER — LIDOCAINE HCL (CARDIAC) 20 MG/ML IV SOLN
INTRAVENOUS | Status: DC | PRN
  Administered 2015-01-03: 80 mg via INTRAVENOUS

## 2015-01-03 MED ORDER — MEPERIDINE HCL 50 MG PO TABS
50.0000 mg | ORAL_TABLET | Freq: Once | ORAL | Status: AC
  Administered 2015-01-03: 50 mg via ORAL
  Filled 2015-01-03: qty 1

## 2015-01-03 MED ORDER — HEPARIN SODIUM (PORCINE) 5000 UNIT/ML IJ SOLN: 5000.0000 [IU] | Freq: Three times a day (TID) | INTRAMUSCULAR | Status: DC

## 2015-01-03 MED ORDER — LACTATED RINGERS IV SOLN
INTRAVENOUS | Status: DC | PRN
  Administered 2015-01-03: 20:00:00 via INTRAVENOUS

## 2015-01-03 MED ORDER — CARBIDOPA-LEVODOPA 25-100 MG PO TABS
2.0000 | ORAL_TABLET | Freq: Every day | ORAL | Status: DC
  Administered 2015-01-05: 2 via ORAL
  Filled 2015-01-03 (×3): qty 2

## 2015-01-03 MED ORDER — SODIUM CHLORIDE 0.9 % IV SOLN
10000.0000 ug | INTRAVENOUS | Status: DC | PRN
  Administered 2015-01-03: .2 ug/min via INTRAVENOUS

## 2015-01-03 MED ORDER — PRAVASTATIN SODIUM 20 MG PO TABS
20.0000 mg | ORAL_TABLET | Freq: Every day | ORAL | Status: DC
  Administered 2015-01-04 – 2015-01-06 (×3): 20 mg via ORAL
  Filled 2015-01-03 (×3): qty 1

## 2015-01-03 MED ORDER — CARBIDOPA-LEVODOPA 25-100 MG PO TABS
2.0000 | ORAL_TABLET | Freq: Every day | ORAL | Status: DC
  Administered 2015-01-04 – 2015-01-07 (×17): 2 via ORAL
  Filled 2015-01-03 (×23): qty 2

## 2015-01-03 MED ORDER — ONDANSETRON HCL 4 MG/2ML IJ SOLN
4.0000 mg | Freq: Four times a day (QID) | INTRAMUSCULAR | Status: DC | PRN
  Administered 2015-01-03: 4 mg via INTRAVENOUS

## 2015-01-03 MED ORDER — ASPIRIN 81 MG PO CHEW: 81.0000 mg | CHEWABLE_TABLET | Freq: Every day | ORAL | Status: DC

## 2015-01-03 MED ORDER — ONDANSETRON HCL 4 MG PO TABS: 4.0000 mg | ORAL_TABLET | Freq: Four times a day (QID) | ORAL | Status: DC | PRN

## 2015-01-03 MED ORDER — CHLORHEXIDINE GLUCONATE CLOTH 2 % EX PADS
6.0000 | MEDICATED_PAD | Freq: Every day | CUTANEOUS | Status: DC
  Administered 2015-01-03: 6 via TOPICAL

## 2015-01-03 MED ORDER — DEXAMETHASONE SODIUM PHOSPHATE 4 MG/ML IJ SOLN
INTRAMUSCULAR | Status: DC | PRN
  Administered 2015-01-03: 10 mg via INTRAVENOUS

## 2015-01-03 MED ORDER — NEOMYCIN-POLYMYXIN B GU 40-200000 IR SOLN
Status: DC | PRN
  Administered 2015-01-03: 16 mL

## 2015-01-03 MED ORDER — NEOSTIGMINE METHYLSULFATE 10 MG/10ML IV SOLN
INTRAVENOUS | Status: DC | PRN
  Administered 2015-01-03: 2.5 mg via INTRAVENOUS

## 2015-01-03 MED ORDER — ENTACAPONE 200 MG PO TABS
200.0000 mg | ORAL_TABLET | Freq: Four times a day (QID) | ORAL | Status: DC
  Administered 2015-01-03 – 2015-01-07 (×11): 200 mg via ORAL
  Filled 2015-01-03 (×14): qty 1

## 2015-01-03 MED ORDER — ROCURONIUM BROMIDE 100 MG/10ML IV SOLN
INTRAVENOUS | Status: DC | PRN
  Administered 2015-01-03 (×2): 10 mg via INTRAVENOUS
  Administered 2015-01-03: 40 mg via INTRAVENOUS
  Administered 2015-01-03 (×2): 10 mg via INTRAVENOUS

## 2015-01-03 MED ORDER — CARBIDOPA-LEVODOPA 25-100 MG PO TABS: 2.0000 | ORAL_TABLET | Freq: Four times a day (QID) | ORAL | Status: DC

## 2015-01-03 MED ORDER — MEPERIDINE HCL 25 MG/ML IJ SOLN
12.5000 mg | INTRAMUSCULAR | Status: DC | PRN
  Administered 2015-01-03 – 2015-01-04 (×4): 12.5 mg via INTRAVENOUS
  Filled 2015-01-03 (×5): qty 1

## 2015-01-03 MED ORDER — SODIUM CHLORIDE 0.9 % IJ SOLN
3.0000 mL | Freq: Two times a day (BID) | INTRAMUSCULAR | Status: DC
  Administered 2015-01-03: 3 mL via INTRAVENOUS

## 2015-01-03 MED ORDER — VITAMIN D 1000 UNITS PO TABS
2000.0000 [IU] | ORAL_TABLET | Freq: Every day | ORAL | Status: DC
  Administered 2015-01-04 – 2015-01-07 (×4): 2000 [IU] via ORAL
  Filled 2015-01-03 (×4): qty 2

## 2015-01-03 MED ORDER — VENLAFAXINE HCL ER 75 MG PO CP24
150.0000 mg | ORAL_CAPSULE | Freq: Every day | ORAL | Status: DC
  Administered 2015-01-03 – 2015-01-07 (×5): 150 mg via ORAL
  Filled 2015-01-03 (×5): qty 2

## 2015-01-03 MED ORDER — OXYCODONE HCL 5 MG PO TABS
5.0000 mg | ORAL_TABLET | ORAL | Status: DC | PRN
  Filled 2015-01-03: qty 1

## 2015-01-03 MED ORDER — FINASTERIDE 5 MG PO TABS
5.0000 mg | ORAL_TABLET | Freq: Every day | ORAL | Status: DC
  Administered 2015-01-05 – 2015-01-07 (×3): 5 mg via ORAL
  Filled 2015-01-03 (×4): qty 1

## 2015-01-03 MED ORDER — CEFAZOLIN SODIUM-DEXTROSE 2-3 GM-% IV SOLR
2.0000 g | Freq: Once | INTRAVENOUS | Status: AC
  Administered 2015-01-03 – 2015-01-04 (×2): 2 g via INTRAVENOUS
  Filled 2015-01-03: qty 50

## 2015-01-03 MED ORDER — VANCOMYCIN HCL 1000 MG IV SOLR
1000.0000 mg | INTRAVENOUS | Status: DC | PRN
  Administered 2015-01-03: 1000 mg via INTRAVENOUS

## 2015-01-03 MED ORDER — ACETAMINOPHEN 10 MG/ML IV SOLN
INTRAVENOUS | Status: DC | PRN
  Administered 2015-01-03: 1000 mg via INTRAVENOUS

## 2015-01-03 SURGICAL SUPPLY — 61 items
BAG DECANTER STRL (MISCELLANEOUS) ×3 IMPLANT
BLADE SAW 1 (BLADE) ×3 IMPLANT
BONE CEMENT GENTAMICIN (Cement) ×6 IMPLANT
CANISTER SUCT 1200ML W/VALVE (MISCELLANEOUS) ×3 IMPLANT
CANISTER SUCT 3000ML (MISCELLANEOUS) ×6 IMPLANT
CATH FOL LEG HOLDER (MISCELLANEOUS) ×3 IMPLANT
CEMENT BONE GENTAMICIN 40 (Cement) ×2 IMPLANT
CENTRALIZER STEM HIP 12MM (Hips) ×3 IMPLANT
DRAPE INCISE IOBAN 66X60 STRL (DRAPES) ×3 IMPLANT
DRAPE SHEET LG 3/4 BI-LAMINATE (DRAPES) ×3 IMPLANT
DRAPE TABLE BACK 80X90 (DRAPES) ×3 IMPLANT
DRSG DERMACEA 8X12 NADH (GAUZE/BANDAGES/DRESSINGS) ×3 IMPLANT
DRSG OPSITE POSTOP 4X12 (GAUZE/BANDAGES/DRESSINGS) ×3 IMPLANT
DRSG OPSITE POSTOP 4X14 (GAUZE/BANDAGES/DRESSINGS) ×6 IMPLANT
DURAPREP 26ML APPLICATOR (WOUND CARE) ×6 IMPLANT
ELECT BLADE 6.5 EXT (BLADE) ×3 IMPLANT
ELECT CAUTERY BLADE 6.4 (BLADE) ×3 IMPLANT
GAUZE PACK 2X3YD (MISCELLANEOUS) ×3 IMPLANT
GAUZE SPONGE 4X4 12PLY STRL (GAUZE/BANDAGES/DRESSINGS) ×3 IMPLANT
GLOVE BIO SURGEON STRL SZ8 (GLOVE) ×3 IMPLANT
GLOVE BIOGEL M STRL SZ7.5 (GLOVE) ×6 IMPLANT
GLOVE BIOGEL PI IND STRL 9 (GLOVE) ×1 IMPLANT
GLOVE BIOGEL PI INDICATOR 9 (GLOVE) ×2
GLOVE INDICATOR 8.0 STRL GRN (GLOVE) ×3 IMPLANT
GOWN STRL REUS W/ TWL LRG LVL3 (GOWN DISPOSABLE) ×1 IMPLANT
GOWN STRL REUS W/TWL 2XL LVL3 (GOWN DISPOSABLE) ×3 IMPLANT
GOWN STRL REUS W/TWL LRG LVL3 (GOWN DISPOSABLE) ×2
GOWN STRL REUS W/TWL LRG LVL4 (GOWN DISPOSABLE) ×3 IMPLANT
GOWN STRL REUS W/TWL XL LVL4 (GOWN DISPOSABLE) ×3 IMPLANT
HANDPIECE SUCTION TUBG SURGILV (MISCELLANEOUS) ×3 IMPLANT
HEAD FEM UNIPOLAR 49 OD STRL (Hips) ×3 IMPLANT
HEMOVAC 400CC 10FR (MISCELLANEOUS) ×3 IMPLANT
HOOD PEEL AWAY FACE SHEILD DIS (HOOD) ×6 IMPLANT
KIT RM TURNOVER STRD PROC AR (KITS) ×3 IMPLANT
NDL SAFETY 18GX1.5 (NEEDLE) ×3 IMPLANT
NEEDLE FILTER BLUNT 18X 1/2SAF (NEEDLE) ×4
NEEDLE FILTER BLUNT 18X1 1/2 (NEEDLE) ×2 IMPLANT
NS IRRIG 1000ML POUR BTL (IV SOLUTION) ×3 IMPLANT
PACK HIP PROSTHESIS (MISCELLANEOUS) ×3 IMPLANT
PAD GROUND ADULT SPLIT (MISCELLANEOUS) ×3 IMPLANT
PRESSURIZER CEMENT PROX FEM SM (MISCELLANEOUS) ×3 IMPLANT
PRESSURIZER FEM CANAL M (MISCELLANEOUS) ×6 IMPLANT
RESTRICTOR CEMENT SZ 5 C-STEM (Cement) ×3 IMPLANT
SOL .9 NS 3000ML IRR  AL (IV SOLUTION) ×2
SOL .9 NS 3000ML IRR UROMATIC (IV SOLUTION) ×1 IMPLANT
SPACER FEM TAPERED +0 12/14 (Hips) ×3 IMPLANT
STAPLER SKIN PROX 35W (STAPLE) ×3 IMPLANT
STEM SUMMIT CEMENT BASIC SZ4 (Hips) ×3 IMPLANT
SUT ETHIBOND #5 BRAIDED 30INL (SUTURE) ×3 IMPLANT
SUT VIC AB 0 CT1 36 (SUTURE) ×3 IMPLANT
SUT VIC AB 1 CT1 36 (SUTURE) ×6 IMPLANT
SUT VIC AB 2-0 CT1 27 (SUTURE) ×2
SUT VIC AB 2-0 CT1 TAPERPNT 27 (SUTURE) ×1 IMPLANT
SYR 20CC LL (SYRINGE) ×3 IMPLANT
SYR TB 1ML LUER SLIP (SYRINGE) ×3 IMPLANT
SYS IRRIGATION SURGILAV PLUS (MISCELLANEOUS) ×3 IMPLANT
TAPE MICROFOAM 4IN (TAPE) ×3 IMPLANT
TAPE TRANSPORE STRL 2 31045 (GAUZE/BANDAGES/DRESSINGS) ×3 IMPLANT
TIP COAXIAL FEMORAL CANAL (MISCELLANEOUS) ×3 IMPLANT
TOWER CARTRIDGE SMART MIX (DISPOSABLE) ×3 IMPLANT
WATER STERILE IRR 1000ML POUR (IV SOLUTION) ×3 IMPLANT

## 2015-01-03 NOTE — Telephone Encounter (Signed)
Spoke to daughter today Will probably have surgery later today

## 2015-01-03 NOTE — Clinical Social Work Note (Signed)
CSW spoke with Crystal at [facility] and she stated that they will try to bring patient in under his [employer] because they will agree to pay the out of network rate since patient is a campus resident. CSW has initiated the [employer] auth via providerlink. The OP note and the PT notes are not yet available and will need to be sent to [facility] for review. York Spaniel MSW,LCSWA [phone]

## 2015-01-03 NOTE — Progress Notes (Signed)
Dr.Hooten here to see patient, discussing plan of care with spouse.

## 2015-01-03 NOTE — ED Notes (Signed)
Admitting MD at the bedside for pt evaluation.  

## 2015-01-03 NOTE — ED Provider Notes (Signed)
[facility] Emergency Department Provider Note  ____________________________________________  Time seen: Upon arrival to the emergency department  I have reviewed the triage vital signs and the nursing notes.   HISTORY  Chief Complaint Fall    HPI [name] Rich is a 81 y.o. male a history of Parkinson's disease and coronary artery disease who presents to the emergency department after a fall on his left hip. Patient was found down by nursing home staff. Says that he was reaching for a candy when he fell off his bed onto the left hip. He denies hitting his head with losing consciousness. Denies any chest pain or shortness of breath prior to or after the fall. He was unable to ambulate after the fall and his walker which is his baseline so was brought to the emergency department. Says that he has been having left hip pain prior to the fall and now that he is at the hospital actually feels better. He is able to range both legs at this time. Pain is only mild to left hip and dull. Worse with motion.   Past Medical History  Diagnosis Date  . Hyperlipidemia   . Coronary artery disease 2010    Dr Gwen Pounds  . Anxiety   . Osteoarthritis of foot   . Parkinson's disease     Dr Kennyth Arnold at [facility] - 2000  . BPH (benign prostatic hypertrophy)   . Essential tremor   . Osteoporosis 2012    forearm only    Patient Active Problem List   Diagnosis Date Noted  . Hernia of abdominal cavity 03/08/2014  . Inguinal hernia 11/29/2013  . Angina pectoris 11/29/2013  . Nausea alone 11/29/2013  . Pain in limb 06/06/2013  . Dermatophytosis of nail 06/06/2013  . Foot pain 06/06/2013  . Osteoporosis   . Dementia in Parkinson's disease 11/29/2012  . Orthostatic lightheadedness 10/13/2012  . Hallux rigidus of left foot 05/09/2012  . Anxiety   . Hyperlipidemia   . Coronary artery disease   . Osteoarthritis of foot   . Parkinson's disease   . BPH (benign prostatic hypertrophy)      Past Surgical History  Procedure Laterality Date  . Coronary stent placement  3/10  . Cholecystectomy    . Tonsillectomy and adenoidectomy  childhood  . Lumbosacral fusion  1978  . Joint replacement  1999    Left knee TKR  . Carpal tunnel release      left  . Cataract extraction      right  . Hernia repair Right 03-20-14    Right scrotal hernia,large Ultrapro mesh, excision of angiolipoma.    Current Outpatient Rx  Name  Route  Sig  Dispense  Refill  . aspirin 81 MG tablet   Oral   Take 81 mg by mouth daily.         . carbidopa-levodopa (SINEMET IR) 25-100 MG per tablet   Oral   Take 2 tablets by mouth 4 (four) times daily.   240 tablet   5   . cholecalciferol (VITAMIN D) 1000 UNITS tablet   Oral   Take 2,000 Units by mouth daily.          . entacapone (COMTAN) 200 MG tablet   Oral   Take 200 mg by mouth 4 (four) times daily.         . finasteride (PROSCAR) 5 MG tablet   Oral   Take 1 tablet (5 mg total) by mouth daily.   90 tablet  3   . lovastatin (MEVACOR) 40 MG tablet   Oral   Take 0.5 tablets (20 mg total) by mouth at bedtime.   15 tablet   0   . Polyethylene Glycol 3350 (MIRALAX PO)   Oral   Take by mouth daily as needed.         Marland Kitchen RANEXA 500 MG 12 hr tablet      TAKE ONE TABLET TWICE A DAY   60 tablet   11   . venlafaxine (EFFEXOR) 75 MG tablet      TAKE 1 TABLET EVERY DAY   30 tablet   11     Allergies Morphine and related; Xylocaine-epinephrine; and Exelon  Family History  Problem Relation Age of Onset  . Heart disease Mother   . Stroke Father   . Diabetes Father   . Cancer Brother   . Heart disease Brother   . Hypertension Brother   . Diabetes Brother     Social History History  Substance Use Topics  . Smoking status: Never Smoker   . Smokeless tobacco: Never Used  . Alcohol Use: No     Comment: very little    Review of Systems Constitutional: No fever/chills Eyes: No visual changes. ENT: No sore  throat. Cardiovascular: Denies chest pain. Respiratory: Denies shortness of breath. Gastrointestinal: No abdominal pain.  No nausea, no vomiting.  No diarrhea.  No constipation. Genitourinary: Negative for dysuria. Musculoskeletal: Negative for back pain. Skin: Negative for rash. Neurological: Negative for headaches, focal weakness or numbness.  10-point ROS otherwise negative.  ____________________________________________   PHYSICAL EXAM:  VITAL SIGNS: ED Triage Vitals  Enc Vitals Group     BP 01/03/15 0155 131/84 mmHg     Pulse Rate 01/03/15 0155 74     Resp --      Temp 01/03/15 0155 98.1 F (36.7 C)     Temp Source 01/03/15 0155 Oral     SpO2 01/03/15 0155 94 %     Weight 01/03/15 0155 154 lb (69.854 kg)     Height 01/03/15 0155  (1.676 m)     Head Cir --      Peak Flow --      Pain Score 01/03/15 0157 4     Pain Loc --      Pain Edu? --      Excl. in GC? --     Constitutional: Alert and oriented to self, year and birthdate. Does not know month or day the week. Well appearing and in no acute distress. Eyes: Conjunctivae are normal. PERRL. EOMI. Head: Atraumatic. Nose: No congestion/rhinnorhea. Mouth/Throat: Mucous membranes are moist.  Oropharynx non-erythematous. Neck: No stridor.   Cardiovascular: Normal rate, regular rhythm. Grossly normal heart sounds.  Good peripheral circulation. Respiratory: Normal respiratory effort.  No retractions. Lungs CTAB. Gastrointestinal: Soft and nontender. No distention. No abdominal bruits. No CVA tenderness. Musculoskeletal: Tenderness to the left anterior hip. Patient actively ranging bilaterally with mild pain only on the left side. Bilateral moderate edema which the patient says is his baseline.  Neurologic:  Normal speech and language. No gross focal neurologic deficits are appreciated. Speech is normal. No gait instability. Resting tremor consistent with Parkinson's Skin:  Skin is warm, dry and intact. No rash  noted. Psychiatric: Mood and affect are normal. Speech and behavior are normal.  ____________________________________________   LABS (all labs ordered are listed, but only abnormal results are displayed)  Labs Reviewed  CBC - Abnormal; Notable for the following:  RBC 4.06 (*)    Hemoglobin 12.5 (*)    HCT 38.2 (*)    All other components within normal limits  BASIC METABOLIC PANEL - Abnormal; Notable for the following:    Glucose, Bld 112 (*)    BUN 23 (*)    GFR calc non Af Amer 58 (*)    All other components within normal limits  TROPONIN I  PROTIME-INR  APTT  TYPE AND SCREEN   ____________________________________________  EKG  ED ECG REPORT I, Arelia Longest, the attending physician, personally viewed and interpreted this ECG.   Date: 01/03/2015  EKG Time: 159  Rate: 76  Rhythm: normal sinus rhythm, EKG machine read as accelerated junctional rhythm but likely sinus with tremor secondary to parkinsonian tremor  Axis: Normal axis  Intervals:none  ST&T Change: No ST elevations or depressions. No abnormal T-wave inversions  ____________________________________________  RADIOLOGY  Chest x-ray with chronic bronchitic changes.  Mildly displaced subcapital fracture to the left femoral neck. ____________________________________________   PROCEDURES   ____________________________________________   INITIAL IMPRESSION / ASSESSMENT AND PLAN / ED COURSE  Pertinent labs & imaging results that were available during my care of the patient were reviewed by me and considered in my medical decision making (see chart for details).  ----------------------------------------- 4:00 AM on 01/03/2015 -----------------------------------------  Patient now requesting pain medication however with morphine and related allergy. Said had CODE BLUE last time used morphine. Discussed with Dr. Ernest Pine orthopedics and does not want patient have Toradol. Will see patient in several  hours when he comes in. Signed out to Dr. Sheryle Hail of the medicine service and says will attempt pain control with Demerol since is not related closed to morphine.  ____________________________________________   FINAL CLINICAL IMPRESSION(S) / ED DIAGNOSES  Acute left hip fracture. Initial visit.    Myrna Blazer, MD 01/03/15 [phone]

## 2015-01-03 NOTE — Clinical Social Work Note (Signed)
Clinical Social Work Assessment  Patient Details  Name: [name] Rich MRN: [id number] Date of Birth: 1929/03/09  Date of referral:  01/03/15               Reason for consult:  Facility Placement                Permission sought to share information with:    Permission granted to share information::     Name::        Agency::     Relationship::     Contact Information:     Housing/Transportation Living arrangements for the past 2 months:  [place] of Information:  Adult Children Patient Interpreter Needed:  None Criminal Activity/Legal Involvement Pertinent to Current Situation/Hospitalization:  No - Comment as needed Significant Relationships:    Lives with:  Spouse, Adult Children Do you feel safe going back to the place where you live?  Yes Need for family participation in patient care:  No (Coment)  Care giving concerns:  Resides at [facility] since October of 2015   Social Worker assessment / plan:  CSW met with patient's daughter in patient's room this morning and she was able to confirm that patient is a LTC resident at [facility] and is private pay. Patient has [employer] and [place] is out of network with [employer]. Patient's wife has currently left to go home to take her medications and rest. Patient is expected to go to surgery this afternoon and the plan according to patient's daughter will be for patient to return to [facility]. FL2 placed on patient's chart. CSW to contact [facility] to determine if any contact with [employer] for an authorization will be necessary.  Employment status:  Retired Nurse, adult PT Recommendations:  Not assessed at this time Information / Referral to community resources:  [place]  Patient/Family's Response to care:  Optimistic Patient/Family's Understanding of and Emotional Response to Diagnosis, Current Treatment, and Prognosis: Patient's daughter  verbalized agreement and understanding of current plan and expressed appreciation for CSW assistance.   Emotional Assessment Appearance:  Appears stated age Attitude/Demeanor/Rapport:   (patient sleeping at time of assessment with patient's daughter: Langston Masker: [phone]) Affect (typically observed):  Unable to Assess Orientation:    Alcohol / Substance use:  Not Applicable Psych involvement (Current and /or in the community):  No (Comment)  Discharge Needs  Concerns to be addressed:  No discharge needs identified Readmission within the last 30 days:  No Current discharge risk:  None Barriers to Discharge:  No Barriers Identified   Shela Leff, LCSW 01/03/2015, 10:52 AM

## 2015-01-03 NOTE — Anesthesia Preprocedure Evaluation (Addendum)
Anesthesia Evaluation  Patient identified by MRN, date of birth, ID band Patient confused    Reviewed: Allergy & Precautions, NPO status , Patient's Chart, lab work & pertinent test results  History of Anesthesia Complications Negative for: history of anesthetic complications  Airway Mallampati: II  TM Distance: >3 FB Neck ROM: limited  Mouth opening: Limited Mouth Opening  Dental no notable dental hx.    Pulmonary neg pulmonary ROS,    Pulmonary exam normal       Cardiovascular Exercise Tolerance: Poor + angina with exertion + CAD + Valvular Problems/Murmurs AS Rhythm:Regular Rate:Normal + Systolic murmurs HLD; TTE 6/17 with moderate-severe aortic stenosis, moderate TR, LVEF >55%   Neuro/Psych PSYCHIATRIC DISORDERS Anxiety Depression Parkinson's and essetial tremor...dementia  Neuromuscular disease    GI/Hepatic negative GI ROS, Neg liver ROS,   Endo/Other  negative endocrine ROS  Renal/GU negative Renal ROS     Musculoskeletal  (+) Arthritis -, Osteoarthritis,    Abdominal   Peds  Hematology negative hematology ROS (+)   Anesthesia Other Findings BPH  Reproductive/Obstetrics                          Anesthesia Physical Anesthesia Plan  ASA: IV  Anesthesia Plan: General   Post-op Pain Management:    Induction: Intravenous  Airway Management Planned: Oral ETT  Additional Equipment:   Intra-op Plan:   Post-operative Plan: Extubation in OR  Informed Consent: I have reviewed the patients History and Physical, chart, labs and discussed the procedure including the risks, benefits and alternatives for the proposed anesthesia with the patient or authorized representative who has indicated his/her understanding and acceptance.   Dental advisory given  Plan Discussed with: CRNA and Surgeon  Anesthesia Plan Comments:         Anesthesia Quick Evaluation

## 2015-01-03 NOTE — Progress Notes (Signed)
[facility] [place] at [facility]   PATIENT NAME: [name] Rich    MR#:  [id number]  DATE OF BIRTH:  Oct 11, 1928  SUBJECTIVE:  CHIEF COMPLAINT:   Chief Complaint  Patient presents with  . Fall   Patient is 81 year old male who presents to emergency room after falling in the nursing home area that apparently lost balance and struck left hip on the floor and immediately felt pain. Patient had no loss of consciousness.  Hip x-ray done in the emergency room revealed mildly displaced subcapital fracture of left femoral neck. Hospitalist services were contacted for admission and consultation a patient who is about to undergo surgery. She was seen by Dr. Ernest Pine who recommended left hemiarthroplasty. Patient is somewhat somnolent and not able to provide much history or review of systems today, but denies any significant pain.  Review of Systems  Unable to perform ROS: mental acuity    VITAL SIGNS: Blood pressure 115/62, pulse 72, temperature 98.1 F (36.7 C), temperature source Oral, resp. rate 18, height  (1.676 m), weight 69.854 kg (154 lb), SpO2 95 %.  PHYSICAL EXAMINATION:   GENERAL:  81 y.o.-year-old patient lying in the bed with no acute distress. Comfortable in the bed, has difficulty opening his eyes or conversing,  seems to be very stiff, rigid EYES: Pupils equal, round, reactive to light and accommodation. No scleral icterus. Extraocular muscles intact.  HEENT: Head atraumatic, normocephalic. Oropharynx and nasopharynx clear.  NECK:  Supple, no jugular venous distention. No thyroid enlargement, no tenderness.  LUNGS: Normal breath sounds bilaterally, no wheezing, rales,rhonchi or crepitation. No use of accessory muscles of respiration.  CARDIOVASCULAR: S1, S2 normal. No murmurs, rubs, or gallops.  ABDOMEN: Soft, nontender, nondistended. Bowel sounds present. No organomegaly or mass.  EXTREMITIES: No pedal edema, cyanosis, or clubbing.  NEUROLOGIC:  Cranial nerves II through XII are intact. Muscle strength 5/5 in all extremities. Sensation intact. Gait not checked.  PSYCHIATRIC: The patient is alert and oriented x 3.  SKIN: No obvious rash, lesion, or ulcer.   ORDERS/RESULTS REVIEWED:   CBC  Recent Labs Lab 01/03/15 0208  WBC 6.4  HGB 12.5*  HCT 38.2*  PLT 245  MCV 94.2  MCH 30.9  MCHC 32.8  RDW 14.4   ------------------------------------------------------------------------------------------------------------------  Chemistries   Recent Labs Lab 01/03/15 0208  NA 139  K 3.9  CL 102  CO2 32  GLUCOSE 112*  BUN 23*  CREATININE 1.13  CALCIUM 9.2   ------------------------------------------------------------------------------------------------------------------ estimated creatinine clearance is 44.7 mL/min (by C-G formula based on Cr of 1.13). ------------------------------------------------------------------------------------------------------------------  Recent Labs  01/03/15 0208  TSH 9.427*    Cardiac Enzymes  Recent Labs Lab 01/03/15 0208  TROPONINI <0.03   ------------------------------------------------------------------------------------------------------------------ Invalid input(s): POCBNP ---------------------------------------------------------------------------------------------------------------  RADIOLOGY: Dg Chest 1 View  01/03/2015   CLINICAL DATA:  Patient fell from chair on the floor. History of osteoporosis and Parkinson's disease.  EXAM: CHEST  1 VIEW  COMPARISON:  04/03/2014  FINDINGS: Shallow inspiration. Normal heart size and pulmonary vascularity. Mild bronchiectasis and peribronchial thickening suggesting chronic bronchitis. No focal airspace disease or consolidation in the lungs. No blunting of costophrenic angles. No pneumothorax. Calcified and tortuous aorta. Degenerative changes in the spine.  IMPRESSION: Chronic bronchitic changes. No evidence of active pulmonary disease.    Electronically Signed   By: Burman Nieves M.D.   On: 01/03/2015 02:58   Dg Hip Unilat With Pelvis 1v Left  01/03/2015   CLINICAL DATA:  Status post fall from chair,  with left hip pain. Initial encounter.  EXAM: LEFT HIP (WITH PELVIS) 1 VIEW  COMPARISON:  CT of the abdomen and pelvis from 11/15/2013  FINDINGS: There appears to be a mildly displaced subcapital fracture through the left femoral neck. The left femoral head remains seated at the acetabulum.  The right hip joint is grossly unremarkable in appearance. Mild degenerative change is noted at the lower lumbar spine. Mild sclerotic change is seen at the sacroiliac joints. The visualized bowel gas pattern is grossly unremarkable.  IMPRESSION: Mildly displaced subcapital fracture through the left femoral neck.   Electronically Signed   By: Roanna Raider M.D.   On: 01/03/2015 02:59    EKG:  Orders placed or performed during the hospital encounter of 01/03/15  . ED EKG  . ED EKG  . EKG 12-Lead  . EKG 12-Lead    ASSESSMENT AND PLAN:  Active Problems:   Femur fracture, left 1. Closed mildly displaced subcapital fracture through the left femoral neck, continue pain medications. Patient is to undergo orthopedic surgery, likely left hemiarthroplasty. She is daughter is agreeable. She understands risks, which I discussed this patient at length today and is agreeable to undergo surgery. She requests sitter at the bedside. She would like the patient to receive medications to control his behavior in the perioperative period. Apparently patient was on Plavix in the past, however,  no Plavix was ordered on admission or seen on MAR from skilled nursing facility. 2.  Cardiac murmur, apparently it is moderate valvular aortic stenosis and moderate mitral valvular regurgitation with severe LVH and hypercontractile left ventricular function,  normal ejection fraction. Patient had an echocardiogram in June 2015, he was also seen by cardiologist, Dr. Gwen Pounds  and was allowed to have a hernia operation in June 2015. He also had a Myoview stress test done in June 2015, which was negative for inducible ischemia in June 2015. Patient was felt to be at low risk for cardiovascular problems perioperatively by Dr. Gwen Pounds.  3. Parkinson's disease. Patient's medications were adjusted according to prior patient's use. Discussed with patient's daughter 4. Anemia, unclear etiology at this time. Get guaiac. Follow after operation. Transfuse as needed   Management plans discussed with the patient, family and they are in agreement.   DRUG ALLERGIES:  Allergies  Allergen Reactions  . Morphine And Related     Passed out in hospital  . Xylocaine-Epinephrine [Lidocaine-Epinephrine] Other (See Comments)    faint  . Exelon [Rivastigmine]     Felt "real bad"    CODE STATUS:     Code Status Orders        Start     Ordered   01/03/15 0554  Do not attempt resuscitation (DNR)   Continuous    Question Answer Comment  In the event of cardiac or respiratory ARREST Do not call a "code blue"   In the event of cardiac or respiratory ARREST Do not perform Intubation, CPR, defibrillation or ACLS   In the event of cardiac or respiratory ARREST Use medication by any route, position, wound care, and other measures to relive pain and suffering. May use oxygen, suction and manual treatment of airway obstruction as needed for comfort.      01/03/15 0553    Advance Directive Documentation        Most Recent Value   Type of Advance Directive  Healthcare Power of Attorney   Pre-existing out of facility DNR order (yellow form or pink MOST form)     "MOST"  Form in Place?        TOTAL TIME TAKING CARE OF THIS PATIENT: 50 minutes.  Prolonged discussion about patient's condition, the risks as well as benefits of orthopedic operation with patient's daughter, who was agreeable for patient to undergo operation despite known cardiovascular risks. Time spent approximately 20  minutes for discussion.   Katharina Caper M.D on 01/03/2015 at 11:04 AM  Between 7am to 6pm - Pager - [phone]  After 6pm go to [email or web address] - password [email or web address] [facility]  [place] [facility]  Office  [phone]  CC: Primary care physician; [name], MD

## 2015-01-03 NOTE — ED Notes (Signed)
Patient states he slid out of bed tonight while reaching for a piece of candy.  C/o left hip pain. States left hip hurting prior to fall.

## 2015-01-03 NOTE — Telephone Encounter (Signed)
PLEASE NOTE: All timestamps contained within this report are represented as [place] Standard Time. CONFIDENTIALTY NOTICE: This fax transmission is intended only for the addressee. It contains information that is legally privileged, confidential or otherwise protected from use or disclosure. If you are not the intended recipient, you are strictly prohibited from reviewing, disclosing, copying using or disseminating any of this information or taking any action in reliance on or regarding this information. If you have received this fax in error, please notify us immediately by telephone so that we can arrange for its return to [place]. Phone: [phone], Toll-Free: [phone], Fax: [phone] Page: 1 of 1 Call Id: [id number] [facility] Night - Client TELEPHONE ADVICE RECORD [facility] Patient Name: [name] Rich Gender: Male DOB: [birth year] Age: 81 Y 8 M 1 D Return Phone Number: Address: City/State/Zip: [place] [facility] Night - Client Client Site  [facility] [place] - Night Physician [name] Contact Type Call Caller Name Donella Stade Phone Number [phone] Relationship To Patient Care Giver Is this call to report lab results? No Call Type Page Only Initial Comment Caller states she is Myriam Jacobson with [facility] @ [phone] reporting a fall and they have left hip pain. Going to the ER for an X-ray. Did not want to page the on call. General Information Type Message Only Nurse Assessment Guidelines Guideline Title Affirmed Question Affirmed Notes Nurse Date/Time (Eastern Time) Disp. Time Lamount Cohen Time) Disposition Final User 01/03/2015 1:15:38 AM General Information Provided Yes Dennison Bulla After Care Instructions Given Call Event Type User Date / Time Description

## 2015-01-03 NOTE — Progress Notes (Signed)
Patient admitted this am with left hip fx s/p fall at the [facility], NPO for surgery pending surgery later tonight, daughters at the bedside, unable to give parkinson's med due to patient refusing and spitting it out, had one dose of parkinson's med this am, IV fluids started- NS at 100 ml/hr- left forearm. Oxygen at 2L per [place]- due to decrease oxygen sat 87% room air. Placed on isolation post-positive nasal swab MRSA. Demerol IV 12.5mg  sivp given as needed with relief, incontinence of urine and bowel- briefs changed as needed, will continue to monitor the patient.

## 2015-01-03 NOTE — Progress Notes (Signed)
Echo staff here to do the 2-D ECHO per MD order.

## 2015-01-03 NOTE — Consult Note (Addendum)
CARDIOLOGY CONSULT NOTE  Patient ID: [name] Rich MRN: [id number] DOB/AGE: [birth year] 81 y.o.  Admit date: 01/03/2015 Referring Physician [place] Primary Physician   Primary Cardiologist [facility] Reason for Consultation poc  HPI: Sent as an 81 year old male with history of Parkinson's disease who recently suffered a mechanical fall in his nursing home room causing a mildly displaced subcapital fracture of the left femoral neck. He is now being seen with regard to preoperative risk assessment prior to recommended left hemiarthroplasty. Patient is somewhat somnolent and not a very good historian however his wife is present to assist in the history taking. She states she is ambulating with a walker most of the time. She states that when ambulating he denies any chest pain or shortness of breath. He was evaluated with a functional study and echocardiogram less than a year ago. This revealed preserved left ventricular function with moderate LVH. There was no high-grade valvular disease. He had a functional study which revealed normal ejection fraction and no reversible ischemia. Patient is at somewhat increased risk from a cardiac standpoint for surgery given his advanced age however appears well optimized. Would proceed with surgery with current medications with no further noninvasive or invasive workup.  ROS Review of Systems - History obtained from spouse General ROS: positive for  - Somnolence Respiratory ROS: no cough, shortness of breath, or wheezing Cardiovascular ROS: no chest pain or dyspnea on exertion Neurological ROS: Lethargic and somnolent   Past Medical History  Diagnosis Date  . Hyperlipidemia   . Coronary artery disease 2010    Dr Gwen Pounds  . Anxiety   . Osteoarthritis of foot   . Parkinson's disease     Dr Kennyth Arnold at [facility] - 2000  . BPH (benign prostatic hypertrophy)   . Essential tremor   . Osteoporosis 2012    forearm only    Family History  Problem Relation Age of  Onset  . Heart disease Mother   . Stroke Father   . Diabetes Father   . Cancer Brother   . Heart disease Brother   . Hypertension Brother   . Diabetes Brother     History   Social History  . Marital Status: Married    Spouse Name: N/A  . Number of Children: 5  . Years of Education: N/A   Occupational History  . Retired Insurance underwriter    Social History Main Topics  . Smoking status: Never Smoker   . Smokeless tobacco: Never Used  . Alcohol Use: No     Comment: very little  . Drug Use: No  . Sexual Activity: Not on file   Other Topics Concern  . Not on file   Social History Narrative   Daughter in [place]. 4 other children ([place], [place], [place], [place] to [place])   Has living will   Wife is health care POA (then daughter Fleet Contras)   Would accept resuscitation attempts   Probably would not want tube feeds if cognitively unaware    Past Surgical History  Procedure Laterality Date  . Coronary stent placement  3/10  . Cholecystectomy    . Tonsillectomy and adenoidectomy  childhood  . Lumbosacral fusion  1978  . Joint replacement  1999    Left knee TKR  . Carpal tunnel release      left  . Cataract extraction      right  . Hernia repair Right 03-20-14    Right scrotal hernia,large Ultrapro mesh, excision of angiolipoma.  Prescriptions prior to admission  Medication Sig Dispense Refill Last Dose  . acetaminophen (TYLENOL) 325 MG tablet Take 650 mg by mouth every 8 (eight) hours as needed.     . ARIPiprazole (ABILIFY) 2 MG tablet Take 2 mg by mouth at bedtime.     Marland Kitchen aspirin 81 MG tablet Take 81 mg by mouth daily.   Taking  . calcium carbonate (TUMS - DOSED IN MG ELEMENTAL CALCIUM) 500 MG chewable tablet Chew 2 tablets by mouth 2 (two) times daily as needed for indigestion or heartburn.     . carbidopa-levodopa (SINEMET IR) 25-100 MG per tablet Take 2 tablets by mouth 4 (four) times daily. (Patient taking differently: Take 2  tablets by mouth. ) 240 tablet 5 01/03/2015 at 4am  . cholecalciferol (VITAMIN D) 1000 UNITS tablet Take 2,000 Units by mouth daily.    Taking  . entacapone (COMTAN) 200 MG tablet Take 200 mg by mouth 4 (four) times daily.   Taking  . ferrous sulfate 325 (65 FE) MG tablet Take 325 mg by mouth 1 day or 1 dose.     . finasteride (PROSCAR) 5 MG tablet Take 1 tablet (5 mg total) by mouth daily. 90 tablet 3 Taking  . lovastatin (MEVACOR) 40 MG tablet Take 0.5 tablets (20 mg total) by mouth at bedtime. 15 tablet 0 Taking  . omeprazole (PRILOSEC) 20 MG capsule Take 20 mg by mouth daily.     . Polyethylene Glycol 3350 (MIRALAX PO) Take by mouth daily as needed.   Taking  . RANEXA 500 MG 12 hr tablet TAKE ONE TABLET TWICE A DAY 60 tablet 11 Taking  . venlafaxine XR (EFFEXOR-XR) 150 MG 24 hr capsule Take 150 mg by mouth daily with breakfast.       Physical Exam: Blood pressure 122/70, pulse 79, temperature 98.5 F (36.9 C), temperature source Oral, resp. rate 20, height  (1.676 m), weight 69.854 kg (154 lb), SpO2 95 %.    General appearance: Lethargic Neck: no adenopathy, no carotid bruit, no JVD, supple, symmetrical, trachea midline and thyroid not enlarged, symmetric, no tenderness/mass/nodules Resp: clear to auscultation bilaterally Cardio: regular rate and rhythm, S1, S2 normal, no murmur, click, rub or gallop and normal apical impulse GI: soft, non-tender; bowel sounds normal; no masses,  no organomegaly Extremities: Status post femoral neck fracture on the left Neurologic: Mental status: Alert, oriented, thought content appropriate, alertness: lethargic Labs:   Lab Results  Component Value Date   WBC 6.4 01/03/2015   HGB 12.5* 01/03/2015   HCT 38.2* 01/03/2015   MCV 94.2 01/03/2015   PLT 245 01/03/2015    Recent Labs Lab 01/03/15 0208  NA 139  K 3.9  CL 102  CO2 32  BUN 23*  CREATININE 1.13  CALCIUM 9.2  GLUCOSE 112*   Lab Results  Component Value Date   TROPONINI <0.03  01/03/2015       EKG: Sinus rhythm with no ischemia  ASSESSMENT AND PLAN:  81 year old male with Parkinson's disease who is status post left femoral neck fracture. This was secondary to mechanical fall. There was no obvious evidence of syncope or loss of consciousness or head trauma. Patient is being scheduled for surgical correction. He was evaluated noninvasively approximately one year ago with a functional study showing no ischemia and an echocardiogram in showing preserved LV function with moderate LVH. Given his current situation, would proceed to surgery without further noninvasive or invasive cardiac workup. He is at moderate risk given his age  and comorbid condition. He appears is optimized as possible for this. Would proceed with routine cardiac monitoring. Signed: Dalia Heading MD, [facility] 01/03/2015, 1:08 PM    ECHO revealed ef of 65% with moderate lvh. Aortic valve was moderate to moderatly severely stenosed with ava 0.98 cm2 . This is similar to echo done one year ago. Would agree that limiting afterload reduction would be optimal. Pt is at moderate to high risk for surgery from cardiac standpoint but appears to be optimized as much as possible.

## 2015-01-03 NOTE — Progress Notes (Signed)
Spoke with Anesthesiologist updated 2-D echo is completed.

## 2015-01-03 NOTE — H&P (Addendum)
[name] Rich is an 81 y.o. male.   Chief Complaint: Hip pain HPI: The patient presents emergency department via EMS after falling at his nursing home. The patient states that he was walking approximately 3 steps from his bed to the walker when he lost his balance. He struck his left hip on the floor and immediately felt pain. He denies lightheadedness or loss of consciousness. He did not hit his head when he fell. In the emergency department the patient was found to have a fracture of his left femoral neck. At bedside he complained of pain in many places including his chest but quickly describes the pain as a spasm of his left arm and hand due to his parkinsonian tremor. He denies shortness of breath, nausea, vomiting, diarrhea, or diaphoresis. The emergency department called for admission due to his need for medical management prior to surgical repair of his hip.  Past Medical History  Diagnosis Date  . Hyperlipidemia   . Coronary artery disease 2010    Dr Nehemiah Massed  . Anxiety   . Osteoarthritis of foot   . Parkinson's disease     Dr Marzetta Board at [place]  . BPH (benign prostatic hypertrophy)   . Essential tremor   . Osteoporosis 2012    forearm only    Past Surgical History  Procedure Laterality Date  . Coronary stent placement  3/10  . Cholecystectomy    . Tonsillectomy and adenoidectomy  childhood  . Lumbosacral fusion  1978  . Joint replacement  1999    Left knee TKR  . Carpal tunnel release      left  . Cataract extraction      right  . Hernia repair Right 03-20-14    Right scrotal hernia,large Ultrapro mesh, excision of angiolipoma.    Family History  Problem Relation Age of Onset  . Heart disease Mother   . Stroke Father   . Diabetes Father   . Cancer Brother   . Heart disease Brother   . Hypertension Brother   . Diabetes Brother    Social History:  reports that he has never smoked. He has never used smokeless tobacco. He reports that he does not drink alcohol or use  illicit drugs.  Allergies:  Allergies  Allergen Reactions  . Morphine And Related     Passed out in hospital  . Xylocaine-Epinephrine [Lidocaine-Epinephrine] Other (See Comments)    faint  . Exelon [Rivastigmine]     Felt "real bad"    Medications Prior to Admission  Medication Sig Dispense Refill  . acetaminophen (TYLENOL) 325 MG tablet Take 650 mg by mouth every 8 (eight) hours as needed.    . ARIPiprazole (ABILIFY) 2 MG tablet Take 2 mg by mouth at bedtime.    Marland Kitchen aspirin 81 MG tablet Take 81 mg by mouth daily.    . calcium carbonate (TUMS - DOSED IN MG ELEMENTAL CALCIUM) 500 MG chewable tablet Chew 2 tablets by mouth 2 (two) times daily as needed for indigestion or heartburn.    . carbidopa-levodopa (SINEMET IR) 25-100 MG per tablet Take 2 tablets by mouth 4 (four) times daily. (Patient taking differently: Take 2 tablets by mouth. ) 240 tablet 5  . cholecalciferol (VITAMIN D) 1000 UNITS tablet Take 2,000 Units by mouth daily.     . entacapone (COMTAN) 200 MG tablet Take 200 mg by mouth 4 (four) times daily.    . ferrous sulfate 325 (65 FE) MG tablet Take 325 mg by mouth 1  day or 1 dose.    . finasteride (PROSCAR) 5 MG tablet Take 1 tablet (5 mg total) by mouth daily. 90 tablet 3  . lovastatin (MEVACOR) 40 MG tablet Take 0.5 tablets (20 mg total) by mouth at bedtime. 15 tablet 0  . omeprazole (PRILOSEC) 20 MG capsule Take 20 mg by mouth daily.    . Polyethylene Glycol 3350 (MIRALAX PO) Take by mouth daily as needed.    Marland Kitchen RANEXA 500 MG 12 hr tablet TAKE ONE TABLET TWICE A DAY 60 tablet 11  . venlafaxine XR (EFFEXOR-XR) 150 MG 24 hr capsule Take 150 mg by mouth daily with breakfast.      Results for orders placed or performed during the hospital encounter of 01/03/15 (from the past 48 hour(s))  CBC     Status: Abnormal   Collection Time: 01/03/15  2:08 AM  Result Value Ref Range   WBC 6.4 3.8 - 10.6 K/uL   RBC 4.06 (L) 4.40 - 5.90 MIL/uL   Hemoglobin 12.5 (L) 13.0 - 18.0 g/dL    HCT 38.2 (L) 40.0 - 52.0 %   MCV 94.2 80.0 - 100.0 fL   MCH 30.9 26.0 - 34.0 pg   MCHC 32.8 32.0 - 36.0 g/dL   RDW 14.4 11.5 - 14.5 %   Platelets 245 150 - 440 K/uL  Basic metabolic panel     Status: Abnormal   Collection Time: 01/03/15  2:08 AM  Result Value Ref Range   Sodium 139 135 - 145 mmol/L   Potassium 3.9 3.5 - 5.1 mmol/L   Chloride 102 101 - 111 mmol/L   CO2 32 22 - 32 mmol/L   Glucose, Bld 112 (H) 65 - 99 mg/dL   BUN 23 (H) 6 - 20 mg/dL   Creatinine, Ser 1.13 0.61 - 1.24 mg/dL   Calcium 9.2 8.9 - 10.3 mg/dL   GFR calc non Af Amer 58 (L) >60 mL/min   GFR calc Af Amer >60 >60 mL/min    Comment: (NOTE) The eGFR has been calculated using the CKD EPI equation. This calculation has not been validated in all clinical situations. eGFR's persistently <60 mL/min signify possible Chronic Kidney Disease.    Anion gap 5 5 - 15  Troponin I     Status: None   Collection Time: 01/03/15  2:08 AM  Result Value Ref Range   Troponin I <0.03 <0.031 ng/mL    Comment:        NO INDICATION OF MYOCARDIAL INJURY.   Protime-INR     Status: None   Collection Time: 01/03/15  2:08 AM  Result Value Ref Range   Prothrombin Time 13.8 11.4 - 15.0 seconds   INR 1.04   APTT     Status: None   Collection Time: 01/03/15  2:08 AM  Result Value Ref Range   aPTT 28 24 - 36 seconds   Dg Chest 1 View  01/03/2015   CLINICAL DATA:  Patient fell from chair on the floor. History of osteoporosis and Parkinson's disease.  EXAM: CHEST  1 VIEW  COMPARISON:  04/03/2014  FINDINGS: Shallow inspiration. Normal heart size and pulmonary vascularity. Mild bronchiectasis and peribronchial thickening suggesting chronic bronchitis. No focal airspace disease or consolidation in the lungs. No blunting of costophrenic angles. No pneumothorax. Calcified and tortuous aorta. Degenerative changes in the spine.  IMPRESSION: Chronic bronchitic changes. No evidence of active pulmonary disease.   Electronically Signed   By: Lucienne Capers M.D.   On: 01/03/2015 02:58  Dg Hip Unilat With Pelvis 1v Left  01/03/2015   CLINICAL DATA:  Status post fall from chair, with left hip pain. Initial encounter.  EXAM: LEFT HIP (WITH PELVIS) 1 VIEW  COMPARISON:  CT of the abdomen and pelvis from 11/15/2013  FINDINGS: There appears to be a mildly displaced subcapital fracture through the left femoral neck. The left femoral head remains seated at the acetabulum.  The right hip joint is grossly unremarkable in appearance. Mild degenerative change is noted at the lower lumbar spine. Mild sclerotic change is seen at the sacroiliac joints. The visualized bowel gas pattern is grossly unremarkable.  IMPRESSION: Mildly displaced subcapital fracture through the left femoral neck.   Electronically Signed   By: Garald Balding M.D.   On: 01/03/2015 02:59    Review of Systems  Constitutional: Negative for fever and chills.  HENT: Negative for sore throat and tinnitus.   Eyes: Negative for blurred vision and redness.  Respiratory: Negative for cough and shortness of breath.   Cardiovascular: Negative for chest pain, palpitations, orthopnea and PND.  Gastrointestinal: Negative for nausea, vomiting, abdominal pain and diarrhea.  Genitourinary: Negative for dysuria, urgency and frequency.  Musculoskeletal: Positive for joint pain and falls. Negative for myalgias.  Skin: Negative for rash.       No lesions  Neurological: Negative for speech change, focal weakness and weakness.  Endo/Heme/Allergies: Does not bruise/bleed easily.       No temperature intolerance  Psychiatric/Behavioral: Negative for depression and suicidal ideas.    Blood pressure 134/61, pulse 83, temperature 97.8 F (36.6 C), temperature source Axillary, resp. rate 24, height 5' 6" (1.676 m), weight 69.854 kg (154 lb), SpO2 89 %. Physical Exam  Constitutional: He is oriented to person, place, and time. He appears well-developed and well-nourished. No distress.  HENT:  Head:  Normocephalic and atraumatic.  Mouth/Throat: Oropharynx is clear and moist.  Eyes: Conjunctivae and EOM are normal. Pupils are equal, round, and reactive to light. No scleral icterus.  Neck: Normal range of motion. Neck supple. No JVD present. No tracheal deviation present. No thyromegaly present.  Cardiovascular: Normal rate, regular rhythm, S1 normal and S2 normal.  Exam reveals no gallop and no friction rub.   Murmur heard.  Systolic murmur is present with a grade of 3/6  Respiratory: Effort normal and breath sounds normal. No respiratory distress.  GI: Soft. Bowel sounds are normal. He exhibits no distension. There is no tenderness. There is no guarding.  Musculoskeletal: He exhibits tenderness. He exhibits no edema.  Lymphadenopathy:    He has no cervical adenopathy.  Neurological: He is alert and oriented to person, place, and time. No cranial nerve deficit.  Skin: Skin is warm and dry. No rash noted. No erythema.  Psychiatric: He has a normal mood and affect. His behavior is normal. Thought content normal.     Assessment/Plan This is a 81 year old Caucasian male admitted for fracture of his left hip. 1. Hip fracture: Left femoral neck. Our goal is currently managed the patient's pain. He is allergic to morphine and Novocain. I have discussed with pharmacy giving the patient oral Demerol. Fentanyl will be an appropriate option for postoperative iv pain relief.  2. Perioperative risk: The patient has a 20% cardiac risk for non-thoracic surgery. He parobably has aortic stenosis based on physical exam which makes periods of relative hypotension more dangerous during surgery. Recommend cardiology consultation. Hold aspirin for surgery 3. Coronary artery disease: Stable. Continue Ranexa (cardiac history is unavailable at this  time as his wife has left the building temporarily). 4. Parkinson's disease: Continue Sinemet 5. Depression: Continue Abilify and Effexor  6. DVT prophylaxis:  Heparin 7. GI prophylaxis: None The patient is a DO NOT RESUSCITATE. Time spent on admission orders and patient care approximately 35 minutes  Harrie Foreman 01/03/2015, 6:58 AM

## 2015-01-03 NOTE — Consult Note (Signed)
ORTHOPAEDIC CONSULTATION  REQUESTING PHYSICIAN: Katharina Caper, MD  Chief Complaint: Left hip pain  HPI: [name] Rich is a 81 y.o. male who complains of  left hip pain. He fell while taking several steps without his walker last night. He landed on his left hip and side. He was unable to stand or bear weight due to the left hip pain. Prior to the fall he was ambulatory with a walker. He denied any loss of consciousness. He denied any other injuries. He was unable stand or bear weight due to the left hip pain.  Past Medical History  Diagnosis Date  . Hyperlipidemia   . Coronary artery disease 2010    Dr Gwen Pounds  . Anxiety   . Osteoarthritis of foot   . Parkinson's disease     Dr Kennyth Arnold at [facility] - 2000  . BPH (benign prostatic hypertrophy)   . Essential tremor   . Osteoporosis 2012    forearm only   Past Surgical History  Procedure Laterality Date  . Coronary stent placement  3/10  . Cholecystectomy    . Tonsillectomy and adenoidectomy  childhood  . Lumbosacral fusion  1978  . Joint replacement  1999    Left knee TKR  . Carpal tunnel release      left  . Cataract extraction      right  . Hernia repair Right 03-20-14    Right scrotal hernia,large Ultrapro mesh, excision of angiolipoma.   History   Social History  . Marital Status: Married    Spouse Name: N/A  . Number of Children: 5  . Years of Education: N/A   Occupational History  . Retired Insurance underwriter    Social History Main Topics  . Smoking status: Never Smoker   . Smokeless tobacco: Never Used  . Alcohol Use: No     Comment: very little  . Drug Use: No  . Sexual Activity: Not on file   Other Topics Concern  . None   Social History Narrative   Daughter in [place]. 4 other children ([place], [place], [place], [place] to [place])   Has living will   Wife is health care POA (then daughter Fleet Contras)   Would accept resuscitation attempts   Probably would not want tube feeds if  cognitively unaware   Family History  Problem Relation Age of Onset  . Heart disease Mother   . Stroke Father   . Diabetes Father   . Cancer Brother   . Heart disease Brother   . Hypertension Brother   . Diabetes Brother    Allergies  Allergen Reactions  . Morphine And Related     Passed out in hospital  . Xylocaine-Epinephrine [Lidocaine-Epinephrine] Other (See Comments)    faint  . Exelon [Rivastigmine]     Felt "real bad"   Prior to Admission medications   Medication Sig Start Date End Date Taking? Authorizing Provider  acetaminophen (TYLENOL) 325 MG tablet Take 650 mg by mouth every 8 (eight) hours as needed.   Yes Historical Provider, MD  ARIPiprazole (ABILIFY) 2 MG tablet Take 2 mg by mouth at bedtime.   Yes Historical Provider, MD  aspirin 81 MG tablet Take 81 mg by mouth daily.   Yes Historical Provider, MD  calcium carbonate (TUMS - DOSED IN MG ELEMENTAL CALCIUM) 500 MG chewable tablet Chew 2 tablets by mouth 2 (two) times daily as needed for indigestion or heartburn.   Yes Historical Provider, MD  carbidopa-levodopa (SINEMET IR) 25-100 MG per  tablet Take 2 tablets by mouth 4 (four) times daily. Patient taking differently: Take 2 tablets by mouth.  08/31/13  Yes Rebecca S Tat, DO  cholecalciferol (VITAMIN D) 1000 UNITS tablet Take 2,000 Units by mouth daily.    Yes Historical Provider, MD  entacapone (COMTAN) 200 MG tablet Take 200 mg by mouth 4 (four) times daily.   Yes Historical Provider, MD  ferrous sulfate 325 (65 FE) MG tablet Take 325 mg by mouth 1 day or 1 dose.   Yes Historical Provider, MD  finasteride (PROSCAR) 5 MG tablet Take 1 tablet (5 mg total) by mouth daily. 11/01/13  Yes Karie Schwalbe, MD  lovastatin (MEVACOR) 40 MG tablet Take 0.5 tablets (20 mg total) by mouth at bedtime. 12/14/12  Yes Karie Schwalbe, MD  omeprazole (PRILOSEC) 20 MG capsule Take 20 mg by mouth daily.   Yes Historical Provider, MD  Polyethylene Glycol 3350 (MIRALAX PO) Take by mouth  daily as needed.   Yes Historical Provider, MD  RANEXA 500 MG 12 hr tablet TAKE ONE TABLET TWICE A DAY 01/25/13  Yes Karie Schwalbe, MD  venlafaxine XR (EFFEXOR-XR) 150 MG 24 hr capsule Take 150 mg by mouth daily with breakfast.   Yes Historical Provider, MD   Positive ROS: All other systems have been reviewed and were otherwise negative with the exception of those mentioned in the HPI and as above.  Physical Exam: General: Alert, no acute distress HEENT: Atraumatic normocephalic. The patient is maintaining his eyes closed during conversation. Sclera are clear. Extraocular motion is intact. Oropharynx is clear with moist mucosa. Cardiovascular: Regular rate and rhythm with normal S1 and S2. There is a 3/6 systolic murmur. No gallops or rubs. Pedal pulses are palpable bilaterally. Mild lower extremity demonstrates appreciated. Homans test is negative. Respiratory: Clear to auscultation bilaterally. Abdomen: Soft and non-tender Skin: No lesions in the area of chief complaint Neurologic: Sensory function is grossly intact. Motor strength is grossly 5 over 5 the exception of the left hip musculature which was not assessed secondary to the injury. The patient demonstrates some rigidity in the upper extremities and there is prominent resting tremor bilaterally. Lymphatic: No axillary or cervical lymphadenopathy  MUSCULOSKELETAL: Attempts at range of motion of the left hip reproduces significant groin and hip pain. No significant ecchymosis or swelling to the left hip. The leg is maintained in 5 pounds of [place] traction. A well-healed surgical incision is noted over the left knee consistent with a previous history of total knee arthroplasty. No knee effusion.  Assessment: Left femoral neck fracture  Plan: The findings were discussed in detail with the patient and his wife. We discussed the option of percutaneous pinning versus hemiarthroplasty. I am concerned that there is displacement to have   damaged to the vasculature of the femoral head. I have recommended left hip hemiarthroplasty. The risks and benefits were discussed in detail with the patient and his wife including dislocation. The patient and his wife expressed understanding of the risks and benefits and agreed with plans for left hip hemiarthroplasty.

## 2015-01-03 NOTE — Progress Notes (Signed)
Patient's oxygen level  room air 87%, applied oxygen at 2l per Jamul - oxygen at 2L 95%.

## 2015-01-03 NOTE — Progress Notes (Signed)
Call received from lab- Brad B for critical lab - +MRSA.  Paged Dr.Hooten at [phone], call back received [phone] from MD- updated with pt's test result of positive MRSA. Order received for Vancomycin 1 gm for on-call to OR. Also updated with pt's medication- per daughter patient was on Plavix. Not sure when last dose taken, daughter to find out when last dose was taken . MD to call back- with update on plavix

## 2015-01-04 ENCOUNTER — Inpatient Hospital Stay: Payer: Medicare Other

## 2015-01-04 LAB — BASIC METABOLIC PANEL
Anion gap: 5 (ref 5–15)
BUN: 23 mg/dL — ABNORMAL HIGH (ref 6–20)
CO2: 27 mmol/L (ref 22–32)
Calcium: 8.3 mg/dL — ABNORMAL LOW (ref 8.9–10.3)
Chloride: 104 mmol/L (ref 101–111)
Creatinine, Ser: 0.99 mg/dL (ref 0.61–1.24)
GFR calc Af Amer: >60 mL/min (ref >60)
GFR calc non Af Amer: >60 mL/min (ref >60)
Glucose, Bld: 155 mg/dL — ABNORMAL HIGH (ref 65–99)
POTASSIUM: 4.4 mmol/L (ref 3.5–5.1)
SODIUM: 136 mmol/L (ref 135–145)

## 2015-01-04 LAB — CBC
HCT: 34 % — ABNORMAL LOW (ref 40.0–52.0)
HEMOGLOBIN: 11.4 g/dL — AB (ref 13.0–18.0)
MCH: 31.6 pg (ref 26.0–34.0)
MCHC: 33.6 g/dL (ref 32.0–36.0)
MCV: 93.9 fL (ref 80.0–100.0)
PLATELETS: 219 10*3/uL (ref 150–440)
RBC: 3.62 MIL/uL — ABNORMAL LOW (ref 4.40–5.90)
RDW: 14.2 % (ref 11.5–14.5)
WBC: 12.7 10*3/uL — ABNORMAL HIGH (ref 3.8–10.6)

## 2015-01-04 LAB — HEMOGLOBIN A1C: HEMOGLOBIN A1C: 5.3 % (ref 4.0–6.0)

## 2015-01-04 LAB — T4, FREE: Free T4: 0.81 ng/dL (ref 0.61–1.12)

## 2015-01-04 MED ORDER — OXYCODONE HCL 5 MG PO TABS: 5.0000 mg | ORAL_TABLET | ORAL | Status: DC | PRN

## 2015-01-04 MED ORDER — ONDANSETRON HCL 4 MG PO TABS: 4.0000 mg | ORAL_TABLET | Freq: Four times a day (QID) | ORAL | Status: DC | PRN

## 2015-01-04 MED ORDER — METOCLOPRAMIDE HCL 10 MG PO TABS
10.0000 mg | ORAL_TABLET | Freq: Three times a day (TID) | ORAL | Status: AC
  Administered 2015-01-04 – 2015-01-05 (×7): 10 mg via ORAL
  Filled 2015-01-04 (×8): qty 1

## 2015-01-04 MED ORDER — PHENOL 1.4 % MT LIQD: 1.0000 | OROMUCOSAL | Status: DC | PRN

## 2015-01-04 MED ORDER — FLEET ENEMA 7-19 GM/118ML RE ENEM: 1.0000 | ENEMA | Freq: Once | RECTAL | Status: AC | PRN

## 2015-01-04 MED ORDER — ACETAMINOPHEN 325 MG PO TABS
650.0000 mg | ORAL_TABLET | Freq: Four times a day (QID) | ORAL | Status: DC | PRN
  Administered 2015-01-05 – 2015-01-06 (×4): 650 mg via ORAL
  Filled 2015-01-04 (×4): qty 2

## 2015-01-04 MED ORDER — FENTANYL CITRATE (PF) 100 MCG/2ML IJ SOLN: 25.0000 ug | INTRAMUSCULAR | Status: DC | PRN

## 2015-01-04 MED ORDER — MAGNESIUM HYDROXIDE 400 MG/5ML PO SUSP
30.0000 mL | Freq: Every day | ORAL | Status: DC | PRN
  Administered 2015-01-05: 30 mL via ORAL
  Filled 2015-01-04: qty 30

## 2015-01-04 MED ORDER — SENNOSIDES-DOCUSATE SODIUM 8.6-50 MG PO TABS
1.0000 | ORAL_TABLET | Freq: Two times a day (BID) | ORAL | Status: DC
  Administered 2015-01-05 – 2015-01-06 (×4): 1 via ORAL
  Filled 2015-01-04 (×7): qty 1

## 2015-01-04 MED ORDER — MENTHOL 3 MG MT LOZG: 1.0000 | LOZENGE | OROMUCOSAL | Status: DC | PRN

## 2015-01-04 MED ORDER — PANTOPRAZOLE SODIUM 40 MG PO TBEC
40.0000 mg | DELAYED_RELEASE_TABLET | Freq: Two times a day (BID) | ORAL | Status: DC
  Administered 2015-01-05 – 2015-01-07 (×4): 40 mg via ORAL
  Filled 2015-01-04 (×6): qty 1

## 2015-01-04 MED ORDER — TRAMADOL HCL 50 MG PO TABS: 50.0000 mg | ORAL_TABLET | ORAL | Status: DC | PRN

## 2015-01-04 MED ORDER — ENOXAPARIN SODIUM 30 MG/0.3ML ~~LOC~~ SOLN
30.0000 mg | SUBCUTANEOUS | Status: DC
  Administered 2015-01-05: 30 mg via SUBCUTANEOUS
  Filled 2015-01-04: qty 0.3

## 2015-01-04 MED ORDER — ONDANSETRON HCL 4 MG/2ML IJ SOLN: 4.0000 mg | Freq: Once | INTRAMUSCULAR | Status: DC | PRN

## 2015-01-04 MED ORDER — ONDANSETRON HCL 4 MG/2ML IJ SOLN: 4.0000 mg | Freq: Four times a day (QID) | INTRAMUSCULAR | Status: DC | PRN

## 2015-01-04 MED ORDER — ACETAMINOPHEN 650 MG RE SUPP: 650.0000 mg | Freq: Four times a day (QID) | RECTAL | Status: DC | PRN

## 2015-01-04 MED ORDER — SODIUM CHLORIDE 0.9 % IV SOLN
INTRAVENOUS | Status: DC
  Administered 2015-01-04 – 2015-01-05 (×4): via INTRAVENOUS

## 2015-01-04 MED ORDER — ACETAMINOPHEN 10 MG/ML IV SOLN
1000.0000 mg | Freq: Four times a day (QID) | INTRAVENOUS | Status: AC
  Administered 2015-01-04 (×4): 1000 mg via INTRAVENOUS
  Filled 2015-01-04 (×4): qty 100

## 2015-01-04 MED ORDER — VENLAFAXINE HCL ER 75 MG PO CP24: 150.0000 mg | ORAL_CAPSULE | Freq: Every day | ORAL | Status: DC

## 2015-01-04 MED ORDER — CEFAZOLIN SODIUM-DEXTROSE 2-3 GM-% IV SOLR
2.0000 g | Freq: Four times a day (QID) | INTRAVENOUS | Status: AC
  Administered 2015-01-04 (×4): 2 g via INTRAVENOUS
  Filled 2015-01-04 (×4): qty 50

## 2015-01-04 MED ORDER — ARIPIPRAZOLE 2 MG PO TABS
2.0000 mg | ORAL_TABLET | Freq: Every day | ORAL | Status: DC
  Administered 2015-01-05 – 2015-01-06 (×2): 2 mg via ORAL
  Filled 2015-01-04 (×5): qty 1

## 2015-01-04 MED ORDER — CALCIUM CARBONATE ANTACID 500 MG PO CHEW: 2.0000 | CHEWABLE_TABLET | Freq: Two times a day (BID) | ORAL | Status: DC | PRN

## 2015-01-04 MED ORDER — FERROUS SULFATE 325 (65 FE) MG PO TABS
325.0000 mg | ORAL_TABLET | ORAL | Status: DC
  Administered 2015-01-05 – 2015-01-07 (×3): 325 mg via ORAL
  Filled 2015-01-04 (×3): qty 1

## 2015-01-04 MED ORDER — BISACODYL 10 MG RE SUPP: 10.0000 mg | Freq: Every day | RECTAL | Status: DC | PRN

## 2015-01-04 NOTE — Brief Op Note (Signed)
01/03/2015 - 01/04/2015  12:26 AM  PATIENT:  [name] Rich  81 y.o. male  PRE-OPERATIVE DIAGNOSIS:  left femoral neck fx  POST-OPERATIVE DIAGNOSIS:  same  PROCEDURE:  Left hip hemiarthroplasty  SURGEON:  Surgeon(s) and Role:    * Donato Heinz, MD - Primary  ASSISTANTS: none   ANESTHESIA:   general  EBL:     BLOOD ADMINISTERED:none  DRAINS: 2 hemovac   LOCAL MEDICATIONS USED:  NONE  SPECIMEN:  Source of Specimen:  left femoral head  DISPOSITION OF SPECIMEN:  PATHOLOGY  COUNTS:  YES  TOURNIQUET:  * No tourniquets in log *  DICTATION: .Dragon Dictation  PLAN OF CARE: Admit to inpatient   PATIENT DISPOSITION:  PACU - hemodynamically stable.   Delay start of Pharmacological VTE agent (>24hrs) due to surgical blood loss or risk of bleeding: yes

## 2015-01-04 NOTE — Progress Notes (Signed)
[facility] - [place] at [facility]   PATIENT NAME: [name] Rich    MR#:  [id number]  DATE OF BIRTH:  [birth year]  SUBJECTIVE:  CHIEF COMPLAINT:   Chief Complaint  Patient presents with  . Fall   Patient is 81 year old male who presents to emergency room after falling in the nursing home area that apparently lost balance and struck left hip on the floor and immediately felt pain. Patient had no loss of consciousness.  Hip x-ray done in the emergency room revealed mildly displaced subcapital fracture of left femoral neck. The patient was seen by Dr. Ernest Pine who recommended left hemiarthroplasty. He was also evaluated by cardiologist , Dr. Lady Gary, who felt,  based on the patient's repeated  Echocardiogram and functional status the patient is in moderate to high risk of cardiovascular complications. However, he felt that patient's condition has been optimized. Patient underwent left hip hemiarthroplasty by Dr. Ernest Pine on 02/02/2015. He admits of significant discomfort, but not able to elaborate Review of Systems  Unable to perform ROS: mental acuity   Denies any significant pain VITAL SIGNS: Blood pressure 126/60, pulse 63, temperature 98.8 F (37.1 C), temperature source Oral, resp. rate 16, height  (1.676 m), weight 80.015 kg (176 lb 6.4 oz), SpO2 100 %.  PHYSICAL EXAMINATION:   GENERAL:  81 y.o.-year-old patient lying in the bed with no acute distress. Comfortable in the bed, able to open his eyes briefly and converse,   very stiff, rigid, also has parkinsonian hand shakiness EYES: Pupils equal, round, reactive to light and accommodation. No scleral icterus. Extraocular muscles intact.  HEENT: Head atraumatic, normocephalic. Oropharynx and nasopharynx clear.  NECK:  Supple, no jugular venous distention. No thyroid enlargement, no tenderness.  LUNGS: Normal breath sounds bilaterally, no wheezing, rales,rhonchi or crepitation. No use of accessory muscles of  respiration.  CARDIOVASCULAR: S1, S2 normal. No murmurs, rubs, or gallops.  ABDOMEN: Soft, nontender, nondistended. Bowel sounds present. No organomegaly or mass.  EXTREMITIES: No pedal edema, cyanosis, or clubbing.  NEUROLOGIC: Cranial nerves II through XII are intact. Muscle strength 5/5 in all extremities. Sensation intact. Gait not checked.  PSYCHIATRIC: The patient is alert and oriented x 3.  SKIN: No obvious rash, lesion, or ulcer. Left hip area has dressing, but no significant swelling or bleeding was noted  ORDERS/RESULTS REVIEWED:   CBC  Recent Labs Lab 01/03/15 0208 01/04/15 0330  WBC 6.4 12.7*  HGB 12.5* 11.4*  HCT 38.2* 34.0*  PLT 245 219  MCV 94.2 93.9  MCH 30.9 31.6  MCHC 32.8 33.6  RDW 14.4 14.2   ------------------------------------------------------------------------------------------------------------------  Chemistries   Recent Labs Lab 01/03/15 0208 01/04/15 0330  NA 139 136  K 3.9 4.4  CL 102 104  CO2 32 27  GLUCOSE 112* 155*  BUN 23* 23*  CREATININE 1.13 0.99  CALCIUM 9.2 8.3*   ------------------------------------------------------------------------------------------------------------------ estimated creatinine clearance is 56.2 mL/min (by C-G formula based on Cr of 0.99). ------------------------------------------------------------------------------------------------------------------  Recent Labs  01/03/15 0208  TSH 9.427*    Cardiac Enzymes  Recent Labs Lab 01/03/15 0208  TROPONINI <0.03   ------------------------------------------------------------------------------------------------------------------ Invalid input(s): POCBNP ---------------------------------------------------------------------------------------------------------------  RADIOLOGY: Dg Chest 1 View  01/03/2015   CLINICAL DATA:  Patient fell from chair on the floor. History of osteoporosis and Parkinson's disease.  EXAM: CHEST  1 VIEW  COMPARISON:  04/03/2014   FINDINGS: Shallow inspiration. Normal heart size and pulmonary vascularity. Mild bronchiectasis and peribronchial thickening suggesting chronic bronchitis. No focal airspace disease or consolidation in  the lungs. No blunting of costophrenic angles. No pneumothorax. Calcified and tortuous aorta. Degenerative changes in the spine.  IMPRESSION: Chronic bronchitic changes. No evidence of active pulmonary disease.   Electronically Signed   By: Burman Nieves M.D.   On: 01/03/2015 02:58   Dg Hip Unilat With Pelvis 1v Left  01/03/2015   CLINICAL DATA:  Status post fall from chair, with left hip pain. Initial encounter.  EXAM: LEFT HIP (WITH PELVIS) 1 VIEW  COMPARISON:  CT of the abdomen and pelvis from 11/15/2013  FINDINGS: There appears to be a mildly displaced subcapital fracture through the left femoral neck. The left femoral head remains seated at the acetabulum.  The right hip joint is grossly unremarkable in appearance. Mild degenerative change is noted at the lower lumbar spine. Mild sclerotic change is seen at the sacroiliac joints. The visualized bowel gas pattern is grossly unremarkable.  IMPRESSION: Mildly displaced subcapital fracture through the left femoral neck.   Electronically Signed   By: Roanna Raider M.D.   On: 01/03/2015 02:59   Dg Hip Port Unilat With Pelvis 1v Left  01/04/2015   CLINICAL DATA:  Postoperative left hip hemiarthroplasty.  EXAM: LEFT HIP (WITH PELVIS) 1 VIEW PORTABLE  COMPARISON:  01/03/2015  FINDINGS: Interval postoperative changes with placement of left hip hemiarthroplasty. Cemented femoral component. Components appear well seated. Skin clips, surgical drains, and subcutaneous emphysema consistent with recent surgery. Degenerative changes in the lower lumbar spine and right hip. Pelvis appears intact.  IMPRESSION: Postoperative left hip hemiarthroplasty. Component appears well seated.   Electronically Signed   By: Burman Nieves M.D.   On: 01/04/2015 01:04    EKG:   Orders placed or performed during the hospital encounter of 01/03/15  . ED EKG  . ED EKG  . EKG 12-Lead  . EKG 12-Lead    ASSESSMENT AND PLAN:  Active Problems:   Femur fracture, left 1. Closed mildly displaced subcapital fracture through the left femoral neck, status post left hemiarthroplasty by Dr. Ernest Pine on 01/03/2015 , continue pain medications, physical therapy. Patient would benefit from rehabilitation placement  2.  Moderate to severe valvular aortic stenosis with aortic valve area of 0.98 cm and severe LVH,  normal ejection fraction. Patient had an echocardiogram on this admission and was seen by the cardiologist, Dr. Lady Gary, who felt that patient has risks. However, he was optimized for surgery. Watching for fluid overload, blood pressure as well as oxygenation is perfect at present.  3. Parkinson's disease. Patient's medications were adjusted according to prior patient's use. Discussed with patient's wife today 4. Anemia, chronic and acute posthemorrhagic-postoperative,  follow with therapy, transfuse as needed . Get guaiac. Follow after operation. Transfuse as needed 5. Elevated TSH. Questionable hypothyroidism. Repeat TSH as well as free T4 6 . Positive for MRSA,  contact isolation 7. Hyperglycemia. Get hemoglobin A1c   Management plans discussed with the patient, family and they are in agreement.   DRUG ALLERGIES:  Allergies  Allergen Reactions  . Morphine And Related     Passed out in hospital  . Xylocaine-Epinephrine [Lidocaine-Epinephrine] Other (See Comments)    faint  . Exelon [Rivastigmine]     Felt "real bad"    CODE STATUS:     Code Status Orders        Start     Ordered   01/03/15 0554  Do not attempt resuscitation (DNR)   Continuous    Question Answer Comment  In the event of cardiac or respiratory ARREST  Do not call a "code blue"   In the event of cardiac or respiratory ARREST Do not perform Intubation, CPR, defibrillation or ACLS   In the event  of cardiac or respiratory ARREST Use medication by any route, position, wound care, and other measures to relive pain and suffering. May use oxygen, suction and manual treatment of airway obstruction as needed for comfort.      01/03/15 0553    Advance Directive Documentation        Most Recent Value   Type of Advance Directive  Healthcare Power of Attorney   Pre-existing out of facility DNR order (yellow form or pink MOST form)     "MOST" Form in Place?        TOTAL TIME TAKING CARE OF THIS PATIENT: 35 minutes.  Marland Kitchen   Katharina Caper M.D on 01/04/2015 at 12:23 PM  Between 7am to 6pm - Pager - [phone]  After 6pm go to [email or web address] - password [email or web address] [facility]  [place]  Hospitalists  Office  [phone]  CC: Primary care physician; [name], MD

## 2015-01-04 NOTE — Op Note (Signed)
DATE OF SURGERY:  01/03/2015 - 01/04/2015  PATIENT NAME:  [name] Rich   DOB: 1928/12/21  MRN: [id number]  PRE-OPERATIVE DIAGNOSIS: Left femoral neck fracture  POST-OPERATIVE DIAGNOSIS:  Same  PROCEDURE:  Left hip hemiarthroplasty  SURGEON:  Jena Gauss. M.D.  ANESTHESIA: general  ESTIMATED BLOOD LOSS: 200 mL  FLUIDS REPLACED: 700 mL of crystalloid  DRAINS: 2 medium drains to a Hemovac reservoir  IMPLANTS UTILIZED: DePuy size 4 Summit femoral stem (cemented), 12 mm Cementralizer, 49 mm OD Cathcart hip ball, +0 mm tapered spacer, and a size 5 femoral cement restrictor  INDICATIONS FOR SURGERY: [name] Rich is a 81 y.o. year old male who fell and sustained a displaced left femoral neck fracture. After discussion of the risks and benefits of surgical intervention, the patient expressed understanding of the risks benefits and agree with plans for hip hemiarthroplasty.   The risks, benefits, and alternatives were discussed at length including but not limited to the risks of infection, bleeding, nerve injury, stiffness, blood clots, the need for revision surgery, limb length inequality, dislocation, cardiopulmonary complications, among others, and they were willing to proceed.  PROCEDURE IN DETAIL: The patient was brought into the operating room and, after adequate general anesthesia was achieved, patient was placed in a right lateral decubitus position. Axillary roll was placed and all bony prominences were well-padded. The patient's left hip was cleaned and prepped with alcohol and DuraPrep and draped in the usual sterile fashion. A "timeout" was performed as per usual protocol. A lateral curvilinear incision was made gently curving towards the posterior superior iliac spine. The IT band was incised in line with the skin incision and the fibers of the gluteus maximus were split in line. The piriformis tendon was identified, skeletonized, and incised at its insertion to the proximal femur and  reflected posteriorly. A T type posterior capsulotomy was performed. The femoral head was then removed using a corkscrew device. The femoral head was measured using calipers and ring gauges and determined to be 49 mm in diameter.The femoral neck cut was performed using an oscillating saw. The acetabulum was inspected for any bony fragments. The articular surface was in good condition.  Attention was then directed to the proximal femur. A pilot hole for preparation of the proximal femoral canal was created using a high-speed bur. The femoral canal finder was inserted followed by insertion of the conical reamer. Serial broaches were inserted up to a size 4 broach. Calcar region was planed and a trial reduction was performed using a 49 mm OD Cathcart ball with a +0 mm neck length. Good equalization of limb lengths was appreciated and excellent stability was noted both anteriorly and posteriorly. Trial components were removed. The femoral canal was sized and was felt that a size 5 cement restrictor was appropriate. The cement restrictor was inserted to the appropriate depth in the femoral canal was irrigated with copious amounts of fluid using the pulse lavage and suctioned dry. The femoral canal was then packed with vaginal packing soaked in dilute Neo-Synephrine. Polymethylmethacrylate cement was per usual fashion using a vacuum mixer. Vaginal packing was removed and the canal again irrigated and suctioned dry. The polymethylmethacrylate cement was inserted in retrograde fashion and pressurized. The size 4 Summit femoral component with a 12 mm Cementralizer was positioned and impacted into place. Excess cement was removed using Personal assistant. After adequate curing of the cement, the Morse taper was cleaned and dried. A 49 mm outer diameter Cathcart hip ball with a +0  mm tapered spacer was placed on the trunnion and impacted into place. The acetabulum was again irrigated and suctioned dry, making sure to inspect  for any residal bony debris. The femoral head was then reduced and placed through a range of motion. Excellent stability was noted both anteriorly and posteriorly. Good equalization of limb lengths was appreciated.   The wound was irrigated with copious amounts of normal saline with antibiotic solution and suctioned dry. Good hemostasis was appreciated. The posterior capsulotomy was repaired using #5 Ethibond. Piriformis tendon was reapproximated to the undersurface of the gluteus medius tendon using #5 Ethibond. Two medium drains were placed in the wound bed and brought out through separate stab incisions to be attached to a Hemovac reservoir. The IT band was reapproximated using interrupted sutures of #1 Vicryl. Subcutaneous tissue was proximal phalanx using first #0 Vicryl followed by #2-0 Vicryl. The skin was closed with skin staples.  The patient tolerated the procedure well and was transported to the recovery room in stable condition.   Jena Gauss., M.D.

## 2015-01-04 NOTE — Progress Notes (Signed)
Received pt back from surgery around 02:00. Pt still sleeping soundly, responding to touch. No complaints of pain at this time. Dressing is dry and intact. hemovac in place. Foley patent and draining urine. IV fluids infusing without difficulty. Neuro checks wdl.

## 2015-01-04 NOTE — Transfer of Care (Signed)
Immediate Anesthesia Transfer of Care Note  Patient: [name] Rich  Procedure(s) Performed: Procedure(s): ARTHROPLASTY BIPOLAR HIP (HEMIARTHROPLASTY) (Left)  Patient Location: PACU  Anesthesia Type:General  Level of Consciousness: sedated  Airway & Oxygen Therapy: Patient connected to face mask oxygen  Post-op Assessment: Report given to RN  Post vital signs: Reviewed and stable  Last Vitals:  Filed Vitals:   01/03/15 1654  BP: 120/70  Pulse:   Temp: 36.3 C  Resp: 18    Complications: No apparent anesthesia complications

## 2015-01-04 NOTE — Anesthesia Procedure Notes (Signed)
Procedure Name: Intubation Date/Time: 01/03/2015 8:30 PM Performed by: Junious Silk Pre-anesthesia Checklist: Patient identified, Emergency Drugs available, Suction available, Patient being monitored and Timeout performed Patient Re-evaluated:Patient Re-evaluated prior to inductionOxygen Delivery Method: Circle system utilized Preoxygenation: Pre-oxygenation with 100% oxygen Intubation Type: IV induction Ventilation: Mask ventilation without difficulty Grade View: Grade II Tube type: Oral Tube size: 7.5 mm Airway Equipment and Method: Oral airway and Stylet Placement Confirmation: ETT inserted through vocal cords under direct vision,  positive ETCO2 and breath sounds checked- equal and bilateral Secured at: 24 cm Tube secured with: Tape

## 2015-01-04 NOTE — Progress Notes (Signed)
  Subjective: 1 Day Post-Op Procedure(s) (LRB): ARTHROPLASTY BIPOLAR HIP (HEMIARTHROPLASTY) (Left) Patient reports pain as mild but patient unable to give accurate report of pain.   Patient is well but unable to give accurate history Plan is to go [facility] nursing home after hospital stay. Negative for chest pain and shortness of breath Fever: no Gastrointestinal:Negative for nausea and vomiting Hemiarthroplasty surgery completed on 6/18 at 0100  Objective: Vital signs in last 24 hours: Temp:  [97 F (36.1 C)-100.2 F (37.9 C)] 97.8 F (36.6 C) (06/18 0615) Pulse Rate:  [46-127] 58 (06/18 0615) Resp:  [8-24] 12 (06/18 0615) BP: (94-142)/(53-112) 116/59 mmHg (06/18 0615) SpO2:  [87 %-100 %] 100 % (06/18 0510) Weight:  [80.015 kg (176 lb 6.4 oz)] 80.015 kg (176 lb 6.4 oz) (06/18 0441)  Intake/Output from previous day:  Intake/Output Summary (Last 24 hours) at 01/04/15 0729 Last data filed at 01/04/15 0554  Gross per 24 hour  Intake   1200 ml  Output   1030 ml  Net    170 ml    Intake/Output this shift:    Labs:  Recent Labs  01/03/15 0208 01/04/15 0330  HGB 12.5* 11.4*    Recent Labs  01/03/15 0208 01/04/15 0330  WBC 6.4 12.7*  RBC 4.06* 3.62*  HCT 38.2* 34.0*  PLT 245 219    Recent Labs  01/03/15 0208 01/04/15 0330  NA 139 136  K 3.9 4.4  CL 102 104  CO2 32 27  BUN 23* 23*  CREATININE 1.13 0.99  GLUCOSE 112* 155*  CALCIUM 9.2 8.3*    Recent Labs  01/03/15 0208  INR 1.04     EXAM General - Patient is Alert, Disorganized and Confused Extremity - ABD soft Sensation intact distally Dorsiflexion/Plantar flexion intact Incision: dressing C/D/I and hemovac draining blood well.  Parkinsonian tremor noted in bilateral feet and hands Dressing/Incision - clean, dry, Hemovac draining blood well, normal dark color. Motor Function - intact, moving foot and toes on exam.  Tremor noted.  Pt is lethargic this morning, wife present in room to assist  with questions.  Moderate-severe parkinsonian tremor present.  Past Medical History  Diagnosis Date  . Hyperlipidemia   . Coronary artery disease 2010    Dr Gwen Pounds  . Anxiety   . Osteoarthritis of foot   . Parkinson's disease     Dr Kennyth Arnold at [facility] - 2000  . BPH (benign prostatic hypertrophy)   . Essential tremor   . Osteoporosis 2012    forearm only    Assessment/Plan: 1 Day Post-Op Procedure(s) (LRB): ARTHROPLASTY BIPOLAR HIP (HEMIARTHROPLASTY) (Left) Active Problems:   Femur fracture, left  Estimated body mass index is 28.49 kg/(m^2) as calculated from the following:   Height as of this encounter: 5\' 6"  (1.676 m).   Weight as of this encounter: 80.015 kg (176 lb 6.4 oz). Advance Diet Continue ABX therapy of Ancef due to MRSA infection Needs to have a BM  PT to evaluate on 6/19 and OT to evaluate on 6/18 Remove Foley on POD 1   DVT Prophylaxis - Lovenox, Foot Pumps and TED hose Weight-Bearing as tolerated to Left leg  J. Horris Latino, PA-C [facility] Surgery 01/04/2015, 7:29 AM

## 2015-01-04 NOTE — Progress Notes (Signed)
Pt was fully awake and speaking with NT and RN. However, pt spit out all his medications, won't take anything by mouth. Started yelling he wanted to speak to the doctor, then fell asleep. NT at bedside as Recruitment consultant.

## 2015-01-04 NOTE — Anesthesia Postprocedure Evaluation (Signed)
  Anesthesia Post-op Note  Patient: [name] Rich  Procedure(s) Performed: Procedure(s): ARTHROPLASTY BIPOLAR HIP (HEMIARTHROPLASTY) (Left)  Anesthesia type:General  Patient location: PACU  Post pain: Pain level controlled  Post assessment: Post-op Vital signs reviewed, Patient's Cardiovascular Status Stable, Respiratory Function Stable, Patent Airway and No signs of Nausea or vomiting  Post vital signs: Reviewed and stable  Last Vitals:  Filed Vitals:   01/04/15 0140  BP: 118/55  Pulse: 62  Temp:   Resp: 15    Level of consciousness: awake, alert  and patient cooperative  Complications: No apparent anesthesia complications

## 2015-01-05 LAB — CBC
HCT: 28 % — ABNORMAL LOW (ref 40.0–52.0)
Hemoglobin: 9.2 g/dL — ABNORMAL LOW (ref 13.0–18.0)
MCH: 30.6 pg (ref 26.0–34.0)
MCHC: 32.7 g/dL (ref 32.0–36.0)
MCV: 93.6 fL (ref 80.0–100.0)
PLATELETS: 194 10*3/uL (ref 150–440)
RBC: 2.99 MIL/uL — ABNORMAL LOW (ref 4.40–5.90)
RDW: 13.9 % (ref 11.5–14.5)
WBC: 9.1 10*3/uL (ref 3.8–10.6)

## 2015-01-05 LAB — BASIC METABOLIC PANEL
Anion gap: 8 (ref 5–15)
BUN: 24 mg/dL — AB (ref 6–20)
CALCIUM: 8.3 mg/dL — AB (ref 8.9–10.3)
CO2: 27 mmol/L (ref 22–32)
Chloride: 104 mmol/L (ref 101–111)
Creatinine, Ser: 0.95 mg/dL (ref 0.61–1.24)
GFR calc Af Amer: >60 mL/min (ref >60)
GFR calc non Af Amer: >60 mL/min (ref >60)
GLUCOSE: 109 mg/dL — AB (ref 65–99)
POTASSIUM: 3.8 mmol/L (ref 3.5–5.1)
Sodium: 139 mmol/L (ref 135–145)

## 2015-01-05 MED ORDER — ENOXAPARIN SODIUM 30 MG/0.3ML ~~LOC~~ SOLN
30.0000 mg | Freq: Two times a day (BID) | SUBCUTANEOUS | Status: DC
  Administered 2015-01-05 – 2015-01-07 (×4): 30 mg via SUBCUTANEOUS
  Filled 2015-01-05 (×4): qty 0.3

## 2015-01-05 MED ORDER — ONDANSETRON HCL 4 MG/2ML IJ SOLN: 4.0000 mg | INTRAMUSCULAR | Status: DC | PRN

## 2015-01-05 MED ORDER — ONDANSETRON HCL 4 MG PO TABS: 4.0000 mg | ORAL_TABLET | ORAL | Status: DC | PRN

## 2015-01-05 NOTE — Evaluation (Signed)
Physical Therapy Evaluation Patient Details Name: [name] Rich MRN: [id number] DOB: [birth year] Today's Date: 01/05/2015   History of Present Illness  Pt with fall, L hip fx needing hemiarthroplasty  Clinical Impression  Pt with considerable lethargy and baseline confusion, but he is able to show good effort.  Pt is very limited with mobility and needs heavy assist just to remain upright with standing/walking attempt.    Follow Up Recommendations SNF    Equipment Recommendations       Recommendations for Other Services       Precautions / Restrictions Precautions Precautions: Posterior Hip Restrictions Weight Bearing Restrictions: Yes LLE Weight Bearing: Weight bearing as tolerated      Mobility  Bed Mobility Overal bed mobility: Needs Assistance Bed Mobility: Supine to Sit     Supine to sit: Max assist        Transfers Overall transfer level: Needs assistance Equipment used: Rolling walker (2 wheeled) Transfers: Sit to/from Stand Sit to Stand: Max assist         General transfer comment: Pt leaning R and backwards during transition needing excessive cuing and assist  Ambulation/Gait Ambulation/Gait assistance: Max assist Ambulation Distance (Feet): 2 Feet (bed to recliner) Assistive device: Rolling walker (2 wheeled)       General Gait Details: Pt shows good effort but is very, very limited with "ambulation." He struggles to move either foot more than a minimal shuffle and leans backward heavily the entire time.  Stairs            Wheelchair Mobility    Modified Rankin (Stroke Patients Only)       Balance                                             Pertinent Vitals/Pain Pain Assessment: 0-10 Pain Score: 7  Pain Location: L hip    Home Living Family/patient expects to be discharged to:: Assisted living               Home Equipment: Walker - 4 wheels      Prior Function Level of Independence: Independent with  assistive device(s)         Comments: Pt uses 4WW for ambualtion at his SNF     Hand Dominance        Extremity/Trunk Assessment   Upper Extremity Assessment: Generalized weakness           Lower Extremity Assessment: Generalized weakness         Communication      Cognition Arousal/Alertness: Lethargic   Overall Cognitive Status: History of cognitive impairments - at baseline                      General Comments      Exercises Total Joint Exercises Ankle Circles/Pumps: AROM;10 reps Quad Sets: AROM;10 reps Heel Slides: AAROM;10 reps Hip ABduction/ADduction: 10 reps;AROM      Assessment/Plan    PT Assessment Patient needs continued PT services  PT Diagnosis Difficulty walking;Generalized weakness   PT Problem List Decreased strength;Decreased range of motion;Decreased activity tolerance;Decreased balance;Decreased mobility;Decreased cognition  PT Treatment Interventions Gait training;Therapeutic activities;Therapeutic exercise;Balance training   PT Goals (Current goals can be found in the Care Plan section) Acute Rehab PT Goals Patient Stated Goal: "Get my strength back" PT Goal Formulation: With patient/family Time For Goal Achievement: 01/19/15 Potential to  Achieve Goals: Fair    Frequency BID   Barriers to discharge        Co-evaluation               End of Session   Activity Tolerance: Patient limited by lethargy;Patient limited by fatigue;Patient limited by pain Patient left: with chair alarm set           Time: 1050-1115 PT Time Calculation (min) (ACUTE ONLY): 25 min   Charges:   PT Evaluation $Initial PT Evaluation Tier I: 1 Procedure PT Treatments $Therapeutic Exercise: 8-22 mins   PT G Codes:       [name] Rich, PT, DPT [phone]  [name] Rich 01/05/2015, 1:15 PM

## 2015-01-05 NOTE — Progress Notes (Signed)
[facility] [place] at [facility]   PATIENT NAME: [name] Rich    MR#:  [id number]  DATE OF BIRTH:  [birth year]  SUBJECTIVE:  CHIEF COMPLAINT:   Chief Complaint  Patient presents with  . Fall   Patient is 81 year old male who presents to emergency room after falling in the nursing home area that apparently lost balance and struck left hip on the floor and immediately felt pain. Patient had no loss of consciousness.  Hip x-ray done in the emergency room revealed mildly displaced subcapital fracture of left femoral neck. The patient was seen by Dr. Ernest Pine who recommended left hemiarthroplasty. He was also evaluated by cardiologist , Dr. Lady Gary, who felt,  based on the patient's repeated  Echocardiogram and functional status the patient is in moderate to high risk of cardiovascular complications. However, he felt that patient's condition has been optimized. Patient underwent left hip hemiarthroplasty by Dr. Ernest Pine on 02/02/2015. He feels better today, less pain  Review of Systems  Unable to perform ROS: mental acuity   Denies any significant pain VITAL SIGNS: Blood pressure 110/58, pulse 235, temperature 98.9 F (37.2 C), temperature source Oral, resp. rate 16, height  (1.676 m), weight 80.015 kg (176 lb 6.4 oz), SpO2 69 %.  PHYSICAL EXAMINATION:   GENERAL:  81 y.o.-year-old patient lying in the bed with no acute distress. Comfortable in the bed, able to open his eyes briefly and converse,   remains very stiff, rigid, also has parkinsonian hand shakiness EYES: Pupils equal, round, reactive to light and accommodation. No scleral icterus. Extraocular muscles intact.  HEENT: Head atraumatic, normocephalic. Oropharynx and nasopharynx clear.  NECK:  Supple, no jugular venous distention. No thyroid enlargement, no tenderness.  LUNGS: Normal breath sounds bilaterally, no wheezing, rales,rhonchi or crepitation. No use of accessory muscles of respiration.  CARDIOVASCULAR:  S1, S2 normal. No murmurs, rubs, or gallops.  ABDOMEN: Soft, nontender, nondistended. Bowel sounds present. No organomegaly or mass.  EXTREMITIES: No pedal edema, cyanosis, or clubbing.  NEUROLOGIC: Cranial nerves II through XII are intact. Muscle strength 5/5 in all extremities. Sensation intact. Gait not checked. Cogwheel type muscular rigidity was noticed .  PSYCHIATRIC: The patient is alert and oriented x 3.  SKIN: No obvious rash, lesion, or ulcer. Left hip area has dressing, but no significant swelling or bleeding was noted  ORDERS/RESULTS REVIEWED:   CBC  Recent Labs Lab 01/03/15 0208 01/04/15 0330 01/05/15 0340  WBC 6.4 12.7* 9.1  HGB 12.5* 11.4* 9.2*  HCT 38.2* 34.0* 28.0*  PLT 245 219 194  MCV 94.2 93.9 93.6  MCH 30.9 31.6 30.6  MCHC 32.8 33.6 32.7  RDW 14.4 14.2 13.9   ------------------------------------------------------------------------------------------------------------------  Chemistries   Recent Labs Lab 01/03/15 0208 01/04/15 0330 01/05/15 0340  NA 139 136 139  K 3.9 4.4 3.8  CL 102 104 104  CO2 32 27 27  GLUCOSE 112* 155* 109*  BUN 23* 23* 24*  CREATININE 1.13 0.99 0.95  CALCIUM 9.2 8.3* 8.3*   ------------------------------------------------------------------------------------------------------------------ estimated creatinine clearance is 58.6 mL/min (by C-G formula based on Cr of 0.95). ------------------------------------------------------------------------------------------------------------------  Recent Labs  01/03/15 0208  TSH 9.427*    Cardiac Enzymes  Recent Labs Lab 01/03/15 0208  TROPONINI <0.03   ------------------------------------------------------------------------------------------------------------------ Invalid input(s): POCBNP ---------------------------------------------------------------------------------------------------------------  RADIOLOGY: Dg Hip Port Unilat With Pelvis 1v Left  01/04/2015   CLINICAL  DATA:  Postoperative left hip hemiarthroplasty.  EXAM: LEFT HIP (WITH PELVIS) 1 VIEW PORTABLE  COMPARISON:  01/03/2015  FINDINGS:  Interval postoperative changes with placement of left hip hemiarthroplasty. Cemented femoral component. Components appear well seated. Skin clips, surgical drains, and subcutaneous emphysema consistent with recent surgery. Degenerative changes in the lower lumbar spine and right hip. Pelvis appears intact.  IMPRESSION: Postoperative left hip hemiarthroplasty. Component appears well seated.   Electronically Signed   By: Burman Nieves M.D.   On: 01/04/2015 01:04    EKG:  Orders placed or performed during the hospital encounter of 01/03/15  . ED EKG  . ED EKG  . EKG 12-Lead  . EKG 12-Lead    ASSESSMENT AND PLAN:  Active Problems:   Femur fracture, left 1. Closed mildly displaced subcapital fracture through the left femoral neck, status post left hemiarthroplasty by Dr. Ernest Pine on 01/03/2015 , continue pain medications, physical therapy. Patient would benefit from rehabilitation placement  2.  Moderate to severe valvular aortic stenosis with aortic valve area of 0.98 cm and severe LVH,  normal ejection fraction. Patient had an echocardiogram on this admission and was seen by the cardiologist, Dr. Lady Gary, who felt that patient has risks. However, he was optimized for surgery. Watching for fluid overload, blood pressure as well as oxygenation is good at present. She is oral intake is not satisfactory, however, we are going to decrease his IV fluid administration  3. Parkinson's disease. Patient's medications were adjusted according to prior patient's use. Discussed with patient's wife yesterday 4. Anemia, chronic and acute posthemorrhagic-postoperative, significant decline of hemoglobin level from 12.5 on admission to 9.2 today,  follow with IV fluids, transfuse as needed . Get guaiac if possible. 5. Elevated TSH. Normal free T4 , likely sick euthyroid 6 . Positive for  MRSA,  contact isolation 7. Hyperglycemia. hemoglobin A1c was 5.3, no diabetes mellitus   Management plans discussed with the patient, family and they are in agreement.   DRUG ALLERGIES:  Allergies  Allergen Reactions  . Morphine And Related     Passed out in hospital  . Xylocaine-Epinephrine [Lidocaine-Epinephrine] Other (See Comments)    faint  . Exelon [Rivastigmine]     Felt "real bad"    CODE STATUS:     Code Status Orders        Start     Ordered   01/03/15 0554  Do not attempt resuscitation (DNR)   Continuous    Question Answer Comment  In the event of cardiac or respiratory ARREST Do not call a "code blue"   In the event of cardiac or respiratory ARREST Do not perform Intubation, CPR, defibrillation or ACLS   In the event of cardiac or respiratory ARREST Use medication by any route, position, wound care, and other measures to relive pain and suffering. May use oxygen, suction and manual treatment of airway obstruction as needed for comfort.      01/03/15 0553    Advance Directive Documentation        Most Recent Value   Type of Advance Directive  Healthcare Power of Attorney   Pre-existing out of facility DNR order (yellow form or pink MOST form)     "MOST" Form in Place?        TOTAL TIME TAKING CARE OF THIS PATIENT: 35 minutes.  Marland Kitchen   Katharina Caper M.D on 01/05/2015 at 11:06 AM  Between 7am to 6pm - Pager - [phone]  After 6pm go to [email or web address] - password [email or web address] [facility]  [facility]  Office  [phone]  CC: Primary care physician; [name], MD

## 2015-01-05 NOTE — Progress Notes (Addendum)
oob to chair today. Tolerated well back to bed . Minimal pain left hip requiring  Tylenol with relief. Vss. Neuro checks to lle wnl. Dressing patent to left hip.MORE ALERT AND TALKATIVE

## 2015-01-05 NOTE — Progress Notes (Addendum)
   Subjective: 2 Days Post-Op Procedure(s) (LRB): ARTHROPLASTY BIPOLAR HIP (HEMIARTHROPLASTY) (Left) Patient reports pain as Unable to communicate.   Patient is well, and has had no acute complaints or problems Continue with physical therapy today.  Plan is to go Skilled nursing facility after hospital stay. no nausea and no vomiting Patient denies any chest pains or shortness of breath. Objective: Vital signs in last 24 hours: Temp:  [98.8 F (37.1 C)-99.8 F (37.7 C)] 98.9 F (37.2 C) (06/18 2333) Pulse Rate:  [63-81] 81 (06/18 2333) Resp:  [16-19] 18 (06/18 2333) BP: (104-126)/(47-97) 126/61 mmHg (06/18 2333) SpO2:  [91 %-100 %] 91 % (06/18 2333) well approximated incision Heels are non tender and elevated off the bed using rolled towels Intake/Output from previous day: 06/18 0701 - 06/19 0700 In: -  Out: 1330 [Urine:1125; Drains:205] Intake/Output this shift:     Recent Labs  01/03/15 0208 01/04/15 0330 01/05/15 0340  HGB 12.5* 11.4* 9.2*    Recent Labs  01/04/15 0330 01/05/15 0340  WBC 12.7* 9.1  RBC 3.62* 2.99*  HCT 34.0* 28.0*  PLT 219 194    Recent Labs  01/04/15 0330 01/05/15 0340  NA 136 139  K 4.4 3.8  CL 104 104  CO2 27 27  BUN 23* 24*  CREATININE 0.99 0.95  GLUCOSE 155* 109*  CALCIUM 8.3* 8.3*    Recent Labs  01/03/15 0208  INR 1.04    EXAM General - Patient is Disorganized and Confused Extremity - Neurologically intact Dressing - dressing C/D/I Motor Function - intact, moving foot and toes well on exam.   Past Medical History  Diagnosis Date  . Hyperlipidemia   . Coronary artery disease 2010    Dr Gwen Pounds  . Anxiety   . Osteoarthritis of foot   . Parkinson's disease     Dr Kennyth Arnold at [facility] - 2000  . BPH (benign prostatic hypertrophy)   . Essential tremor   . Osteoporosis 2012    forearm only    Assessment/Plan: 2 Days Post-Op Procedure(s) (LRB): ARTHROPLASTY BIPOLAR HIP (HEMIARTHROPLASTY) (Left) Active Problems:   Femur fracture, left  Estimated body mass index is 28.49 kg/(m^2) as calculated from the following:   Height as of this encounter:  (1.676 m).   Weight as of this encounter: 80.015 kg (176 lb 6.4 oz). Up with therapy D/C IV fluids Discharge to SNF  Labs: Were reviewed on today's visit DVT Prophylaxis - Lovenox, Foot Pumps and TED hose Weight-Bearing as tolerated to left leg Incentive spirometer to be used every hour We'll need to follow-up at [facility] in 6 weeks Tables will need to be removed at 2 weeks postop Change dressing as needed Hemovac was DC'd on today's visit Transfer to rehabilitation once medically stable and cleared D/C O2 and Pulse OX and try on Room Air  Jon R. [facility] PA [facility] 01/05/2015, 7:33 AM   Pt. appears to have some abdominal distention with tympany on exam this morning. Therefore, it would be advisable to proceed slowly with po intake and to perhaps give him a suppository to stimulate him from below.

## 2015-01-06 ENCOUNTER — Encounter: Payer: Self-pay | Admitting: Orthopedic Surgery

## 2015-01-06 LAB — CBC
HEMATOCRIT: 23.1 % — AB (ref 40.0–52.0)
HEMOGLOBIN: 7.7 g/dL — AB (ref 13.0–18.0)
MCH: 31.3 pg (ref 26.0–34.0)
MCHC: 33.5 g/dL (ref 32.0–36.0)
MCV: 93.5 fL (ref 80.0–100.0)
Platelets: 180 10*3/uL (ref 150–440)
RBC: 2.47 MIL/uL — ABNORMAL LOW (ref 4.40–5.90)
RDW: 13.9 % (ref 11.5–14.5)
WBC: 6.1 10*3/uL (ref 3.8–10.6)

## 2015-01-06 LAB — TYPE AND SCREEN
ABO/RH(D): O NEG
Antibody Screen: NEGATIVE
Unit division: 0

## 2015-01-06 LAB — PREPARE RBC (CROSSMATCH)

## 2015-01-06 LAB — HEMOGLOBIN: Hemoglobin: 9.8 g/dL — ABNORMAL LOW (ref 13.0–18.0)

## 2015-01-06 MED ORDER — OXYCODONE HCL 5 MG PO TABS: 5.0000 mg | ORAL_TABLET | ORAL | Status: AC | PRN

## 2015-01-06 MED ORDER — SODIUM CHLORIDE 0.9 % IV SOLN: Freq: Once | INTRAVENOUS | Status: DC

## 2015-01-06 MED ORDER — SODIUM CHLORIDE 0.9 % IV SOLN
Freq: Once | INTRAVENOUS | Status: AC
  Administered 2015-01-06: 15:00:00 via INTRAVENOUS

## 2015-01-06 MED ORDER — ENOXAPARIN SODIUM 40 MG/0.4ML ~~LOC~~ SOLN: 40.0000 mg | SUBCUTANEOUS | Status: DC

## 2015-01-06 MED ORDER — FUROSEMIDE 10 MG/ML IJ SOLN
20.0000 mg | Freq: Once | INTRAMUSCULAR | Status: AC
  Administered 2015-01-06: 20 mg via INTRAVENOUS
  Filled 2015-01-06: qty 2

## 2015-01-06 NOTE — Progress Notes (Signed)
Per MD patient is not medically stable for D/C to [facility] today. Plan is for patient to D/C to [facility] tomorrow (Tuesday 01/07/15). Clinical Education officer, museum (CSW) contacted [place] unit at [facility] and her aware of above. Per Raquel Sarna plan is for patient to come straight to Memory Care from the hospital instead of rehab. CSW made Raquel Sarna aware that patient is on contact for MRSA Nasal Swab. Per Raquel Sarna will go to a private room. CSW met with patient and his wife and made them aware of above. They are in agreement with plan. CSW will continue to follow and assist as needed.   Blima Rich, [place] [phone]

## 2015-01-06 NOTE — Progress Notes (Signed)
PT Cancellation Note  Patient Details Name: [name] Rich MRN: [id number] DOB: 06-Aug-1928   Cancelled Treatment:    Reason Eval/Treat Not Completed: Patient declined, no reason specified. Family and nursing in room. Spoke with nursing again regarding heel boot, as nursing had questions regarding ordering; nursing to contact MD regarding order. Family/daughter had several questions as well regarding LLE position and abductor wedge. Answered questions to family satisfaction. Pt notes he is too fatigued to tolerate exercises again this afternoon. Pt also speaks illogically/confused several times. Pt awaiting transfusion. Attempt treatment tomorrow.    Elsie Stain Bishop 01/06/2015, 1:52 PM

## 2015-01-06 NOTE — Progress Notes (Signed)
Physical Therapy Treatment Patient Details Name: [name] Rich MRN: [id number] DOB: 01/22/1929 Today's Date: 01/06/2015    History of Present Illness Patient is an 81 year old male who came to [facility] after a fall at his [facility] care unit at [facility].    PT Comments    MD notes pt hemoglobin is low and pt to receive blood transfusion this pm. PT limited to bed exercises due to this and pt symptoms of SOB and lethargy. Pt participates with some increased instruction/time required, but flat affect and keeps eyes closed most of the session. Education to spouse on bed exercises and assisting pt with exercises throughout the day. Pt's LLE even with abductor wedge is noted to bed in valgus and severe internal rotation; spoke with nursing regarding pulling heel bootie with medial wedge in place. Plan to see pt this pm.   Follow Up Recommendations   (Memory care and [facility])     Equipment Recommendations       Recommendations for Other Services       Precautions / Restrictions Precautions Precautions: Posterior Hip Restrictions Weight Bearing Restrictions: Yes LLE Weight Bearing: Weight bearing as tolerated    Mobility  Bed Mobility Overal bed mobility:  (Not tested; limited PT to bed exercises, as low hemoglobin)                Transfers                    Ambulation/Gait                 Stairs            Wheelchair Mobility    Modified Rankin (Stroke Patients Only)       Balance                                    Cognition Arousal/Alertness: Lethargic Behavior During Therapy: Flat affect Overall Cognitive Status: History of cognitive impairments - at baseline       Memory: Decreased recall of precautions              Exercises Total Joint Exercises Ankle Circles/Pumps: AROM;Both;20 reps;Supine Quad Sets: Strengthening;Both;15 reps;Supine Gluteal Sets: Strengthening;Both;15 reps;Supine Towel Squeeze:  Strengthening;Both;20 reps;Supine (pillow) Short Arc Quad: AAROM;AROM;Both;20 reps;Supine Heel Slides: AAROM;Both;20 reps;Supine Hip ABduction/ADduction: AAROM;Both;20 reps;Supine Straight Leg Raises: AAROM;Both;15 reps;Supine    General Comments        Pertinent Vitals/Pain Pain Assessment: No/denies pain    Home Living Family/patient expects to be discharged to::  (Memory care unit )             Home Equipment: Walker - 4 wheels      Prior Function Level of Independence: Needs assistance      Comments: Uses 4 wheeled walker at his memory unit.   PT Goals (current goals can now be found in the care plan section) Acute Rehab PT Goals Patient Stated Goal: "Get my strength back" Progress towards PT goals: Progressing toward goals (slowly)    Frequency  BID    PT Plan Current plan remains appropriate    Co-evaluation             End of Session   Activity Tolerance: Patient tolerated treatment well (Some comprehension issues with increased instruction/time ) Patient left: in bed;with bed alarm set;with family/visitor present;with call bell/phone within reach     Time: 1034-1105 PT  Time Calculation (min) (ACUTE ONLY): 31 min  Charges:  $Therapeutic Exercise: 23-37 mins                    G Codes:      Kristeen Miss 01/06/2015, 11:22 AM

## 2015-01-06 NOTE — Evaluation (Signed)
Occupational Therapy Evaluation Patient Details Name: [name] Rich MRN: [id number] DOB: 1929/05/27 Today's Date: 01/06/2015    History of Present Illness Patient is an 81 year old male who came to [facility] after a fall at his [facility] care unit at [facility].   Clinical Impression   This patient is an 81 year old male who came to [facility] after a fall resulting in a left hip fracture. He lives at [facility] memory care unit and had been walking with a 4 wheeled walker. He wife is present during this evaluation and reports he fell because he did not use his walker. She reports that he had been receiving assist and that he would not remember any instruction. Instructed wife in posterior precautions and illustrated positions.  The wife took notes. He would benefit from Occupational Therapy for family and care giver teaching.    Follow Up Recommendations       Equipment Recommendations       Recommendations for Other Services       Precautions / Restrictions Precautions Precautions: Posterior Hip Restrictions Weight Bearing Restrictions: Yes LLE Weight Bearing: Weight bearing as tolerated      Mobility Bed Mobility                  Transfers                      Balance                                            ADL                                         General ADL Comments: Had recieved assist. Did walk with 4 wheeled walker. Wife reports he fell because he did not use it.     Vision     Perception     Praxis      Pertinent Vitals/Pain       Hand Dominance     Extremity/Trunk Assessment Upper Extremity Assessment Upper Extremity Assessment: Generalized weakness           Communication Communication Communication:  (Speeks clearly, but is not oriented to time, place or situation)   Cognition Arousal/Alertness:  (Alert - confused)   Overall Cognitive Status: History of cognitive impairments - at baseline       Memory: Decreased recall of precautions (Patient not aware he had hip surgery. Wife present)             General Comments       Exercises       Shoulder Instructions      Home Living Family/patient expects to be discharged to::  (Memory care unit )                             Home Equipment: Walker - 4 wheels          Prior Functioning/Environment Level of Independence: Needs assistance        Comments: Uses 4 wheeled walker at his memory unit.    OT Diagnosis: Generalized weakness   OT Problem List: Decreased strength;Decreased activity tolerance;Impaired balance (sitting and/or standing);Decreased cognition;Decreased safety awareness;Decreased knowledge of precautions  OT Treatment/Interventions: Self-care/ADL training (Family and  care giver teaching)    OT Goals(Current goals can be found in the care plan section) Acute Rehab OT Goals Patient Stated Goal: "Get my strength back" OT Goal Formulation: With family Potential to Achieve Goals: Good  OT Frequency: Min 1X/week   Barriers to D/C:            Co-evaluation              End of Session    Activity Tolerance:   Patient left: in bed;with call bell/phone within reach;with bed alarm set;with family/visitor present   Time: 1610-9604 OT Time Calculation (min): 21 min Charges:  OT General Charges $OT Visit: 1 Procedure OT Evaluation $Initial OT Evaluation Tier I: 1 Procedure G-Codes:    Gwyndolyn Kaufman, MS/OTR/L  01/06/2015, 10:15 AM

## 2015-01-06 NOTE — Progress Notes (Signed)
  Subjective: 3 Days Post-Op Procedure(s) (LRB): ARTHROPLASTY BIPOLAR HIP (HEMIARTHROPLASTY) (Left) Patient reports pain as mild.   Patient is well, but has had some minor complaints of decreased appetite and pain Plan is to go Skilled nursing facility after hospital stay.  Pt is schedule to go to [facility]. Negative for chest pain and shortness of breath Fever: no Gastrointestinal:Negative for nausea and vomiting.  Objective: Vital signs in last 24 hours: Temp:  [98 F (36.7 C)-99.4 F (37.4 C)] 98 F (36.7 C) (06/20 0500) Pulse Rate:  [71-235] 71 (06/20 0500) Resp:  [16-17] 17 (06/20 0500) BP: (110-120)/(52-58) 110/54 mmHg (06/20 0500) SpO2:  [69 %-96 %] 96 % (06/20 0500)  Intake/Output from previous day:  Intake/Output Summary (Last 24 hours) at 01/06/15 0727 Last data filed at 01/05/15 1900  Gross per 24 hour  Intake    970 ml  Output      0 ml  Net    970 ml    Intake/Output this shift:    Labs:  Recent Labs  01/04/15 0330 01/05/15 0340 01/06/15 0358  HGB 11.4* 9.2* 7.7*    Recent Labs  01/05/15 0340 01/06/15 0358  WBC 9.1 6.1  RBC 2.99* 2.47*  HCT 28.0* 23.1*  PLT 194 180    Recent Labs  01/04/15 0330 01/05/15 0340  NA 136 139  K 4.4 3.8  CL 104 104  CO2 27 27  BUN 23* 24*  CREATININE 0.99 0.95  GLUCOSE 155* 109*  CALCIUM 8.3* 8.3*   No results for input(s): LABPT, INR in the last 72 hours.   EXAM General - Patient is Alert and Confused Extremity - Sensation intact distally Dorsiflexion/Plantar flexion intact Incision: scant drainage Dressing/Incision - blood tinged drainage Motor Function - intact, moving foot and toes well on exam.  Abdomen is slightly distended with tympany.  Pt complains of mild pain with palpation.  Pt had a bowel movement yesterday and is passing gas.  Past Medical History  Diagnosis Date  . Hyperlipidemia   . Coronary artery disease 2010    Dr Gwen Pounds  . Anxiety   . Osteoarthritis of foot   .  Parkinson's disease     Dr Kennyth Arnold at [facility] - 2000  . BPH (benign prostatic hypertrophy)   . Essential tremor   . Osteoporosis 2012    forearm only    Assessment/Plan: 3 Days Post-Op Procedure(s) (LRB): ARTHROPLASTY BIPOLAR HIP (HEMIARTHROPLASTY) (Left) Active Problems:   Femur fracture, left  Estimated body mass index is 28.49 kg/(m^2) as calculated from the following:   Height as of this encounter: 5\' 6"  (1.676 m).   Weight as of this encounter: 80.015 kg (176 lb 6.4 oz). Advance diet Up with therapy  Continue physical therapy today. Encouraged incentive spirometry. Hg has dropped to 7.7.  Trending down from 9 and 11 prior.  Medicine service notified.  Medicine will monitor as pt is asymptomatic but is close to the threshold for transfusion.  Follow-up in two weeks for staple removal Discharge to SNF when cleared by Medicine service.  DVT Prophylaxis - Lovenox, Foot Pumps and TED hose Weight-Bearing as tolerated to left leg  J. Horris Latino, PA-C [facility] 01/06/2015, 7:27 AM

## 2015-01-06 NOTE — Progress Notes (Signed)
[facility] - [place] at [facility]   PATIENT NAME: [name] Rich    MR#:  [id number]  DATE OF BIRTH:  [birth year]  SUBJECTIVE:  CHIEF COMPLAINT:   Chief Complaint  Patient presents with  . Fall    REVIEW OF SYSTEMS:  ROS  DRUG ALLERGIES:   Allergies  Allergen Reactions  . Morphine And Related     Passed out in hospital  . Xylocaine-Epinephrine [Lidocaine-Epinephrine] Other (See Comments)    faint  . Exelon [Rivastigmine]     Felt "real bad"   VITALS:  Blood pressure 129/55, pulse 108, temperature 98.2 F (36.8 C), temperature source Oral, resp. rate 20, height 5\' 6"  (1.676 m), weight 80.015 kg (176 lb 6.4 oz), SpO2 98 %. PHYSICAL EXAMINATION:  Physical Exam LABORATORY PANEL:   CBC  Recent Labs Lab 01/06/15 0358  WBC 6.1  HGB 7.7*  HCT 23.1*  PLT 180   ------------------------------------------------------------------------------------------------------------------ Chemistries   Recent Labs Lab 01/05/15 0340  NA 139  K 3.8  CL 104  CO2 27  GLUCOSE 109*  BUN 24*  CREATININE 0.95  CALCIUM 8.3*   ASSESSMENT AND PLAN:   1. Anemia: likely acute posthemorrhagic-postoperative, significant decline of hemoglobin level from 12.5 ->11.4-> 9.2->7.7 today.will order 1 unit of packed red blood cell transfusion considering significant decline in hemoglobin.  So as he can be safely discharged, as otherwise he is likely to be admitted for anemia from facility 2. Moderate to severe valvular aortic stenosis with aortic valve area of 0.98 cm and severe LVH, normal ejection fraction: Cardiology Following 3. Parkinson's disease. Patient's medications were adjusted according to prior patient's use. Discussed with patient's wife yesterday 4. Closed mildly displaced subcapital fracture through the left femoral neck, status post left hemiarthroplasty by Dr. Ernest Pine on 01/03/2015 , continue pain medications, physical therapy. Patient would benefit from  rehabilitation placement  5. Elevated TSH. Normal free T4 , likely sick euthyroid 6 . Positive for MRSA, contact isolation 7. Hyperglycemia. hemoglobin A1c was 5.3, no diabetes mellitus   All the records are reviewed and case discussed with Care Management/Social Worker. Management plans discussed with the patient, family and they are in agreement.  CODE STATUS: DO NOT RESUSCITATE  TOTAL TIME TAKING CARE OF THIS PATIENT: 35 minutes.   More than 50% of the time was spent in counseling/coordination of care: YES  POSSIBLE D/C TOMORROW., DEPENDING ON CLINICAL CONDITION.  Needs to get blood transfusion to avoid readmission just for anemia and blood transfusion from facility   [facility], Jakson Delpilar M.D on 01/06/2015 at 2:23 PM  Between 7am to 6pm - Pager - [phone]  After 6pm go to [email or web address] - password [email or web address] [facility]  [place] [facility]  Office  [phone]  CC:  Primary care physician; [name], MD

## 2015-01-06 NOTE — Clinical Social Work Placement (Signed)
   CLINICAL SOCIAL WORK PLACEMENT  NOTE  Date:  01/06/2015  Patient Details  Name: [name] Rich MRN: [id number] Date of Birth: 1928/09/25  Clinical Social Work is seeking post-discharge placement for this patient at the Skilled  Nursing Facility level of care (*CSW will initial, date and re-position this form in  chart as items are completed):  Yes   Patient/family provided with [facility] Clinical Social Work Department's list of facilities offering this level of care within the geographic area requested by the patient (or if unable, by the patient's family).  Yes   Patient/family informed of their freedom to choose among providers that offer the needed level of care, that participate in Medicare, Medicaid or managed care program needed by the patient, have an available bed and are willing to accept the patient.  Yes   Patient/family informed of [facility]'s ownership interest in [facility] and [facility], as well as of the fact that they are under no obligation to receive care at these facilities.  PASRR submitted to EDS on       PASRR number received on       Existing PASRR number confirmed on 01/06/15     FL2 transmitted to all facilities in geographic area requested by pt/family on 01/03/15     FL2 transmitted to all facilities within larger geographic area on       Patient informed that his/her managed care company has contracts with or will negotiate with certain facilities, including the following:        Yes   Patient/family informed of bed offers received.  Patient chooses bed at  [facility] )     Physician recommends and patient chooses bed at      Patient to be transferred to   on  .  Patient to be transferred to facility by       Patient family notified on   of transfer.  Name of family member notified:        PHYSICIAN       Additional Comment:    _______________________________________________ Haig Prophet, LCSW 01/06/2015, 9:48 AM

## 2015-01-06 NOTE — Care Management Note (Signed)
Case Management Note  Patient Details  Name: Janard Culp MRN: [id number] Date of Birth: 01-27-1929  Subjective/Objective:  It is anticipated that pt will discharge to [facility] tomorrow, memory care. CSW following. Will sign off.                    Action/Plan: Shona Simpson tomorrow  Expected Discharge Date:   06/21/216               Expected Discharge Plan:  Skilled Nursing Facility  In-House Referral:  Clinical Social Work  Discharge planning Services  CM Consult  Post Acute Care Choice:    Choice offered to:     DME Arranged:    DME Agency:     HH Arranged:    HH Agency:     Status of Service:  Completed, signed off  Medicare Important Message Given:  Yes Date Medicare IM Given:  01/06/15 Medicare IM give by:  Gweneth Dimitri Date Additional Medicare IM Given:    Additional Medicare Important Message give by:     If discussed at Long Length of Stay Meetings, dates discussed:    Additional Comments:  Marily Memos, RN 01/06/2015, 10:15 AM

## 2015-01-06 NOTE — Progress Notes (Signed)
Called by nursing with critical lab value Hgb 7.7.  This trend from 9 and 11 prior to that.  Pt here with hip fracture s/p surgical repair.  Hold transfusion for now as pt is asymptomatic, though will monitor closely as he is near threshold for transfusion.  HERMINIO, COURSEN [facility] 01/06/2015, 4:34 AM

## 2015-01-06 NOTE — Plan of Care (Signed)
Problem: Acute Rehab OT Goals (only OT should resolve) Goal: OT Additional ADL Goal #1 The patient's family and care givers will understand hip precautions (posterior approach) during ADL in 2 weeks.

## 2015-01-06 NOTE — Progress Notes (Signed)
Pts. Hemoglobin is 7.7. Dr. Anne Hahn notified and no new orders at this time.

## 2015-01-06 NOTE — Progress Notes (Signed)
Pt takes Carvidopa/ Levidopa Q3 day and Q4 at night. Verified with pharmacy. Will continue to monitor.

## 2015-01-06 NOTE — Progress Notes (Signed)
POD 3.Pt. Pleasantly confused. VSS. Pain controlled with PO pain meds. Pills crushed with applesauce. Incontinent of bowel and bladder. Dressing clean dry and intact. Pt. Received bath. For discharge back to [facility] today. Resting quietly.

## 2015-01-07 LAB — TYPE AND SCREEN
ABO/RH(D): O NEG
Antibody Screen: NEGATIVE
Unit division: 0

## 2015-01-07 MED ORDER — ENOXAPARIN SODIUM 40 MG/0.4ML ~~LOC~~ SOLN: 40.0000 mg | SUBCUTANEOUS | Status: AC

## 2015-01-07 NOTE — Progress Notes (Signed)
Occupational Therapy Treatment Patient Details Name: [name] Rich MRN: [id number] DOB: Apr 01, 1929 Today's Date: 01/07/2015    History of present illness Patient is an 81 year old male who came to [facility] after a fall at his [facility] care unit at [facility].   OT comments  Daughter present. Reviewed with her hip precautions (posterior approach) and gave examples of movements during activities of daily living that would be breaking hip precautions and have higher risk of dislocation.  Follow Up Recommendations       Equipment Recommendations       Recommendations for Other Services      Precautions / Restrictions Precautions Precautions: Posterior Hip Restrictions LLE Weight Bearing: Weight bearing as tolerated       Mobility Bed Mobility                  Transfers                      Balance                                   ADL                                                Vision                     Perception     Praxis      Cognition     Overall Cognitive Status: History of cognitive impairments - at baseline       Memory: Decreased recall of precautions               Extremity/Trunk Assessment               Exercises     Shoulder Instructions       General Comments      Pertinent Vitals/ Pain          Home Living Family/patient expects to be discharged to::  (Lives at [facility] memory care unit)                             Home Equipment: Dan Humphreys - 4 wheels          Prior Functioning/Environment Level of Independence: Needs assistance        Comments: Uses 4 wheeled walker at his memory unit.   Frequency       Progress Toward Goals  OT Goals(current goals can now be found in the care plan section)        Plan Discharge plan remains appropriate    Co-evaluation                 End of Session     Activity Tolerance Patient limited  by fatigue   Patient Left in bed;with bed alarm set;with family/visitor present   Nurse Communication          Time: [phone] OT Time Calculation (min): 14 min  Charges: OT General Charges $OT Visit: 1 Procedure OT Treatments $Self Care/Home Management : 8-22 mins  Gwyndolyn Kaufman, MS/OTR/L  01/07/2015, 10:18 AM

## 2015-01-07 NOTE — Evaluation (Signed)
Clinical/Bedside Swallow Evaluation Patient Details  Name: [name] Rich MRN: [id number] Date of Birth: Dec 10, 1928  Today's Date: 01/07/2015 Time: SLP Start Time (ACUTE ONLY): 1050 SLP Stop Time (ACUTE ONLY): 1121 SLP Time Calculation (min) (ACUTE ONLY): 31 min  Past Medical History:  Past Medical History  Diagnosis Date  . Hyperlipidemia   . Coronary artery disease 2010    Dr Gwen Pounds  . Anxiety   . Osteoarthritis of foot   . Parkinson's disease     Dr Kennyth Arnold at [facility] - 2000  . BPH (benign prostatic hypertrophy)   . Essential tremor   . Osteoporosis 2012    forearm only   Past Surgical History:  Past Surgical History  Procedure Laterality Date  . Coronary stent placement  3/10  . Cholecystectomy    . Tonsillectomy and adenoidectomy  childhood  . Lumbosacral fusion  1978  . Joint replacement  1999    Left knee TKR  . Carpal tunnel release      left  . Cataract extraction      right  . Hernia repair Right 03-20-14    Right scrotal hernia,large Ultrapro mesh, excision of angiolipoma.  . Hip arthroplasty Left 01/03/2015    Procedure: ARTHROPLASTY BIPOLAR HIP (HEMIARTHROPLASTY);  Surgeon: Donato Heinz, MD;  Location: [facility] ORS;  Service: Orthopedics;  Laterality: Left;   HPI:      Assessment / Plan / Recommendation Clinical Impression  Pt presented with moderate oropharyngeal dysphagia charactorized by pt delayed swallow of +5 seconds, wet vocal quality and immediate cough with thin liquids via cup and straw. pt with no ssx aspiration wtih puree, ms, or honey thick bolus trials. Pt will be recommended to downgrade diet to NDDS 2 with honey thick liquids. Nursing and social worker notified.     Aspiration Risk  Moderate    Diet Recommendation Dysphagia 2 (Fine chop);Honey   Medication Administration: Crushed with puree Compensations: Follow solids with liquid    Other  Recommendations Oral Care Recommendations: Staff/trained caregiver to provide oral care   Follow Up  Recommendations       Frequency and Duration min 1 x/week  1 week   Pertinent Vitals/Pain Not stated    SLP Swallow Goals     Swallow Study Prior Functional Status       General Date of Onset: 01/07/15 Type of Study: Bedside swallow evaluation Previous Swallow Assessment: No known information.  Diet Prior to this Study: Regular;Thin liquids Temperature Spikes Noted: N/A Respiratory Status: Room air History of Recent Intubation: No Behavior/Cognition: Lethargic/Drowsy Oral Cavity - Dentition: Poor condition Self-Feeding Abilities: Needs assist Patient Positioning: Upright in bed Baseline Vocal Quality: Normal Volitional Cough: Wet    Oral/Motor/Sensory Function Overall Oral Motor/Sensory Function: Appears within functional limits for tasks assessed   Ice Chips Ice chips: Not tested   Thin Liquid Thin Liquid: Impaired Presentation: Cup;Straw Oral Phase Impairments: Reduced labial seal;Impaired anterior to posterior transit Oral Phase Functional Implications: Prolonged oral transit Pharyngeal  Phase Impairments: Cough - Immediate;Cough - Delayed;Suspected delayed Swallow;Multiple swallows;Wet Vocal Quality;Throat Clearing - Immediate    Nectar Thick Nectar Thick Liquid: Impaired Presentation: Cup Pharyngeal Phase Impairments: Cough - Immediate;Wet Vocal Quality   Honey Thick Honey Thick Liquid: Within functional limits Presentation: Cup   Puree Puree: Within functional limits Presentation: Spoon   Solid   GO    Solid: Impaired Presentation: Spoon Oral Phase Impairments: Reduced labial seal;Impaired anterior to posterior transit Oral Phase Functional Implications: Oral residue Pharyngeal Phase Impairments: Multiple swallows Other  Comments: pt WFL for mech soft/ dysphagia 2 trials       Meredith Pel Sauber 01/07/2015,11:22 AM

## 2015-01-07 NOTE — Progress Notes (Signed)
Pt. Confused. VSS. Pain controlled with PO pain meds. Pills crushed with applesauce. Dressing clean dry and intact. Bath and bed completed. Incontinent of urine. For d/c today to [facility]. Resting quietly.

## 2015-01-07 NOTE — Clinical Social Work Placement (Signed)
   CLINICAL SOCIAL WORK PLACEMENT  NOTE  Date:  01/07/2015  Patient Details  Name: [name] Rich MRN: [id number] Date of Birth: 1929/06/23  Clinical Social Work is seeking post-discharge placement for this patient at the Skilled  Nursing Facility level of care (*CSW will initial, date and re-position this form in  chart as items are completed):  Yes   Patient/family provided with [facility] Clinical Social Work Department's list of facilities offering this level of care within the geographic area requested by the patient (or if unable, by the patient's family).  Yes   Patient/family informed of their freedom to choose among providers that offer the needed level of care, that participate in Medicare, Medicaid or managed care program needed by the patient, have an available bed and are willing to accept the patient.  Yes   Patient/family informed of [facility]'s ownership interest in [facility] and [facility], as well as of the fact that they are under no obligation to receive care at these facilities.  PASRR submitted to EDS on       PASRR number received on       Existing PASRR number confirmed on 01/06/15     FL2 transmitted to all facilities in geographic area requested by pt/family on 01/03/15     FL2 transmitted to all facilities within larger geographic area on       Patient informed that his/her managed care company has contracts with or will negotiate with certain facilities, including the following:        Yes   Patient/family informed of bed offers received.  Patient chooses bed at  [facility])     Physician recommends and patient chooses bed at      Patient to be transferred to  [facility] ) on 01/07/15.  Patient to be transferred to facility by  [facility] )     Patient family notified on 01/07/15 of transfer.  Name of family member notified:   (Daughter Fleet Contras was at bedside. )     PHYSICIAN        Additional Comment:    _______________________________________________ Haig Prophet, LCSW 01/07/2015, 9:51 AM

## 2015-01-07 NOTE — Progress Notes (Addendum)
Patient is medically stable for D/C to [facility]. Per [facility] admissions coordinator for [facility] memory care patient is going to private room 213. RN will call report to RN Marcelino Duster at [phone] and arrange EMS for transport. Clinical Child psychotherapist (CSW) prepared D/C packet and faxed D/C Summary, FL2 and prescriptions to [place]. CSW also sent updated DYS 2 with honey thick liquids diet order recommended by SLP.  Patient's daughter Burnell Blanks was at bedside and aware of above. Please reconsult if future social work needs arise. CSW signing off.   Jetta Lout, LCSWA [phone]

## 2015-01-07 NOTE — Progress Notes (Signed)
Called gave report to Jackquline Berlin, RN at [facility]  Room 213.  Called non emergent transportation.  2nd in line for transport.

## 2015-01-07 NOTE — Discharge Summary (Signed)
[facility] [place] [facility]   PATIENT NAME: [name] Rich    MR#:  [id number]  DATE OF BIRTH:  1929/07/01  DATE OF ADMISSION:  01/03/2015   ADMITTING PHYSICIAN: Arnaldo Natal, MD  DATE OF DISCHARGE: 01/07/2015  PRIMARY CARE PHYSICIAN: [name], MD    ADMISSION DIAGNOSIS:  Hip pain [M25.559] Hip fracture, left, closed, initial encounter [S72.002A]  DISCHARGE DIAGNOSIS:  Active Problems:   Femur fracture, left  SECONDARY DIAGNOSIS:   Past Medical History  Diagnosis Date  . Hyperlipidemia   . Coronary artery disease 2010    Dr Gwen Pounds  . Anxiety   . Osteoarthritis of foot   . Parkinson's disease     Dr Kennyth Arnold at [facility] - 2000  . BPH (benign prostatic hypertrophy)   . Essential tremor   . Osteoporosis 2012    forearm only    HOSPITAL COURSE:  Patient is a 81 year old male with above-mentioned medical problem was admitted for left femoral neck fracture status post fall.  Please see Dr. Elias Else dictated history and physical for further details.  Orthopedic consultation was obtained with Dr. Ernest Pine who recommended surgery.  Patient underwent Left hip hemiarthroplasty on 18th of June without any immediate complication.  Patient did require cardiology clearance, which was done by Dr. Lady Gary.  Patient was found to have acute blood loss anemia requiring 1 unit of packed red blood cell transfusion.  There is no obvious bleeding noted.  Her transfusion patient's hemodynamics remain stable.  His hemoglobin on the date of discharge is 9.8 and is being discharged to memory care unit in stable condition.  Patient and family are agreeable with discharge plan and is being discharged to memory care unit in stable condition. DISCHARGE CONDITIONS:   Stable  CONSULTS OBTAINED:  Treatment Team:  Donato Heinz, MD Lamar Blinks, MD Dalia Heading, MD  DRUG ALLERGIES:   Allergies  Allergen Reactions  . Morphine And Related     Passed out in  hospital  . Xylocaine-Epinephrine [Lidocaine-Epinephrine] Other (See Comments)    faint  . Exelon [Rivastigmine]     Felt "real bad"    DISCHARGE MEDICATIONS:   Current Discharge Medication List    START taking these medications   Details  enoxaparin (LOVENOX) 40 MG/0.4ML injection Inject 0.4 mLs (40 mg total) into the skin daily. Qty: 14 Syringe, Refills: 0    oxyCODONE (OXY IR/ROXICODONE) 5 MG immediate release tablet Take 1-2 tablets (5-10 mg total) by mouth every 4 (four) hours as needed for breakthrough pain ((for MODERATE breakthrough pain)). Qty: 60 tablet, Refills: 0      CONTINUE these medications which have NOT CHANGED   Details  acetaminophen (TYLENOL) 325 MG tablet Take 650 mg by mouth every 8 (eight) hours as needed.    ARIPiprazole (ABILIFY) 2 MG tablet Take 2 mg by mouth at bedtime.    aspirin 81 MG tablet Take 81 mg by mouth daily.    calcium carbonate (TUMS - DOSED IN MG ELEMENTAL CALCIUM) 500 MG chewable tablet Chew 2 tablets by mouth 2 (two) times daily as needed for indigestion or heartburn.    carbidopa-levodopa (SINEMET IR) 25-100 MG per tablet Take 2 tablets by mouth 4 (four) times daily. Qty: 240 tablet, Refills: 5    cholecalciferol (VITAMIN D) 1000 UNITS tablet Take 2,000 Units by mouth daily.     entacapone (COMTAN) 200 MG tablet Take 200 mg by mouth 4 (four) times daily.    ferrous sulfate  325 (65 FE) MG tablet Take 325 mg by mouth 1 day or 1 dose.    finasteride (PROSCAR) 5 MG tablet Take 1 tablet (5 mg total) by mouth daily. Qty: 90 tablet, Refills: 3    lovastatin (MEVACOR) 40 MG tablet Take 0.5 tablets (20 mg total) by mouth at bedtime. Qty: 15 tablet, Refills: 0    omeprazole (PRILOSEC) 20 MG capsule Take 20 mg by mouth daily.    Polyethylene Glycol 3350 (MIRALAX PO) Take by mouth daily as needed.    RANEXA 500 MG 12 hr tablet TAKE ONE TABLET TWICE A DAY Qty: 60 tablet, Refills: 11    venlafaxine XR (EFFEXOR-XR) 150 MG 24 hr capsule  Take 150 mg by mouth daily with breakfast.       DISCHARGE INSTRUCTIONS:   DVT Prophylaxis - Lovenox, Foot Pumps and TED hose Weight-Bearing as tolerated to left leg Follow-up 6 weeks with orthopedics Staples are to be removed at 2 weeks postop  DIET:  Cardiac diet  DISCHARGE CONDITION:  Good  ACTIVITY:  Activity as tolerated  OXYGEN:  Home Oxygen: No.   Oxygen Delivery: room air  DISCHARGE LOCATION:  nursing home   If you experience worsening of your admission symptoms, develop shortness of breath, life threatening emergency, suicidal or homicidal thoughts you must seek medical attention immediately by calling 911 or calling your MD immediately  if symptoms less severe.  You Must read complete instructions/literature along with all the possible adverse reactions/side effects for all the Medicines you take and that have been prescribed to you. Take any new Medicines after you have completely understood and accpet all the possible adverse reactions/side effects.   Please note  You were cared for by a hospitalist during your hospital stay. If you have any questions about your discharge medications or the care you received while you were in the hospital after you are discharged, you can call the unit and asked to speak with the hospitalist on call if the hospitalist that took care of you is not available. Once you are discharged, your primary care physician will handle any further medical issues. Please note that NO REFILLS for any discharge medications will be authorized once you are discharged, as it is imperative that you return to your primary care physician (or establish a relationship with a primary care physician if you do not have one) for your aftercare needs so that they can reassess your need for medications and monitor your lab values.    On the day of Discharge:   VITAL SIGNS:  Blood pressure 124/60, pulse 70, temperature 98.2 F (36.8 C), temperature source  Axillary, resp. rate 18, height  (1.676 m), weight 80.015 kg (176 lb 6.4 oz), SpO2 97 %.  I/O:   Intake/Output Summary (Last 24 hours) at 01/07/15 0818 Last data filed at 01/06/15 1700  Gross per 24 hour  Intake    350 ml  Output      0 ml  Net    350 ml   PHYSICAL EXAMINATION:  GENERAL:  81 y.o.-year-old patient lying in the bed with no acute distress.  EYES: Pupils equal, round, reactive to light and accommodation. No scleral icterus. Extraocular muscles intact.  HEENT: Head atraumatic, normocephalic. Oropharynx and nasopharynx clear.  NECK:  Supple, no jugular venous distention. No thyroid enlargement, no tenderness.  LUNGS: Normal breath sounds bilaterally, no wheezing, rales,rhonchi or crepitation. No use of accessory muscles of respiration.  CARDIOVASCULAR: S1, S2 normal. No murmurs, rubs, or gallops.  ABDOMEN: Soft, non-tender, non-distended. Bowel sounds present. No organomegaly or mass.  EXTREMITIES: No pedal edema, cyanosis, or clubbing. Heels are non tender and elevated off the bed using rolled towels NEUROLOGIC: Cranial nerves II through XII are intact. Muscle strength 5/5 in all extremities. Sensation intact. Gait not checked.  PSYCHIATRIC: The patient is alert and oriented x 3.  SKIN: No obvious rash, lesion, or ulcer. well approximated incision.  DATA REVIEW:   CBC  Recent Labs Lab 01/06/15 0358 01/06/15 1933  WBC 6.1  --   HGB 7.7* 9.8*  HCT 23.1*  --   PLT 180  --    Chemistries   Recent Labs Lab 01/05/15 0340  NA 139  K 3.8  CL 104  CO2 27  GLUCOSE 109*  BUN 24*  CREATININE 0.95  CALCIUM 8.3*    Microbiology Results  Results for orders placed or performed during the hospital encounter of 01/03/15  Surgical PCR screen     Status: Abnormal   Collection Time: 01/03/15  7:52 AM  Result Value Ref Range Status   MRSA, PCR POSITIVE (A) NEGATIVE Final    Comment: CRITICAL RESULT CALLED TO, READ BACK BY AND VERIFIED WITH: VHAEINI PATEL @ 0925  01/03/15 BGB    Staphylococcus aureus POSITIVE (A) NEGATIVE Final    Comment:        The Xpert SA Assay (FDA approved for NASAL specimens in patients over 92 years of age), is one component of a comprehensive surveillance program.  Test performance has been validated by [facility] for patients greater than or equal to 32 year old. It is not intended to diagnose infection nor to guide or monitor treatment.     Management plans discussed with the patient, family and they are in agreement.  CODE STATUS: DO NOT RESUSCITATE  TOTAL TIME TAKING CARE OF THIS PATIENT: 55 minutes.    [facility], Nashira Mcglynn M.D on 01/07/2015 at 8:18 AM  Between 7am to 6pm - Pager - [phone]  After 6pm go to [email or web address] - password [email or web address] [facility]  [place] [facility]  Office  [phone]  CC: Primary care physician; [name], MD Dalia Heading, MD Donato Heinz, MD

## 2015-01-07 NOTE — Progress Notes (Signed)
   Subjective: 4 Days Post-Op Procedure(s) (LRB): ARTHROPLASTY BIPOLAR HIP (HEMIARTHROPLASTY) (Left) Patient reports pain as Patient sleeping this a.m.[name] Rich   Patient is well, and has had no acute complaints or problems We will start therapy today.  Plan is to go Rehab after hospital stay. no nausea and no vomiting Patient denies any chest pains or shortness of breath. Objective: Vital signs in last 24 hours: Temp:  [98 F (36.7 C)-98.7 F (37.1 C)] 98.2 F (36.8 C) (06/21 0714) Pulse Rate:  [59-108] 70 (06/21 0714) Resp:  [16-20] 18 (06/21 0714) BP: (100-129)/(55-86) 124/60 mmHg (06/21 0714) SpO2:  [95 %-98 %] 97 % (06/21 0714) FiO2 (%):  [2 %] 2 % (06/21 0434) well approximated incision Heels are non tender and elevated off the bed using rolled towels Intake/Output from previous day: 06/20 0701 - 06/21 0700 In: 350 [I.V.:50; Blood:300] Out: -  Intake/Output this shift:     Recent Labs  01/05/15 0340 01/06/15 0358 01/06/15 1933  HGB 9.2* 7.7* 9.8*    Recent Labs  01/05/15 0340 01/06/15 0358  WBC 9.1 6.1  RBC 2.99* 2.47*  HCT 28.0* 23.1*  PLT 194 180    Recent Labs  01/05/15 0340  NA 139  K 3.8  CL 104  CO2 27  BUN 24*  CREATININE 0.95  GLUCOSE 109*  CALCIUM 8.3*   No results for input(s): LABPT, INR in the last 72 hours.  EXAM General - Patient is Resting comfortably Extremity - Neurologically intact Neurovascular intact Dressing - scant drainage Motor Function - intact, moving foot and toes well on exam.   Past Medical History  Diagnosis Date  . Hyperlipidemia   . Coronary artery disease 2010    Dr Gwen Pounds  . Anxiety   . Osteoarthritis of foot   . Parkinson's disease     Dr Kennyth Arnold at [facility] - 2000  . BPH (benign prostatic hypertrophy)   . Essential tremor   . Osteoporosis 2012    forearm only    Assessment/Plan: 4 Days Post-Op Procedure(s) (LRB): ARTHROPLASTY BIPOLAR HIP (HEMIARTHROPLASTY) (Left) Active Problems:   Femur fracture,  left  Estimated body mass index is 28.49 kg/(m^2) as calculated from the following:   Height as of this encounter: 5\' 6"  (1.676 m).   Weight as of this encounter: 80.015 kg (176 lb 6.4 oz). Up with therapy Discharge to SNF  Labs: Were reviewed. Hemoglobin 9.8 DVT Prophylaxis - Lovenox, Foot Pumps and TED hose Weight-Bearing as tolerated to left leg Bowel movement today Dressing will need to be changed prior to discharge Follow-up 6 weeks Staples are to be removed  at 2 weeks postop  [facility] PA [facility] 01/07/2015, 7:52 AM

## 2015-01-07 NOTE — Progress Notes (Signed)
PT Cancellation Note  Patient Details Name: [name] Rich MRN: [id number] DOB: July 28, 1928   Cancelled Treatment:    Reason Eval/Treat Not Completed: Fatigue/lethargy limiting ability to participate (Family request to let pt sleep declining therapy.) Family and nursing note pt had an eventful night and was unable to sleep. Family concerned PT would agitate pt this a.m and wished pt to continue to rest peacefully. Pt's family does have a couple of questions regarding LLE position and use of abductor wedge; questions answered to their satisfaction and also encouraged family to speak with orthopedic MD regarding length of use. Pt has discharge orders to [facility] Care and [facility] today.    Elsie Stain Bishop 01/07/2015, 10:51 AM

## 2015-01-07 NOTE — Discharge Instructions (Signed)
Diet: As you were doing prior to hospitalization   Shower:  May shower but keep the wounds dry, use an occlusive plastic wrap, NO SOAKING IN TUB.  If the bandage gets wet, change with a clean dry gauze.  Dressing:  You may change your dressing as needed. Change the dressing with sterile gauze dressing.    Activity:  Increase activity slowly as tolerated, but follow the weight bearing instructions below.  No lifting or driving for 6 weeks.  Weight Bearing:   Weight bearing as tolerated to left lower extremity  To prevent constipation: you may use a stool softener such as -  Colace (over the counter) 100 mg by mouth twice a day  Drink plenty of fluids (prune juice may be helpful) and high fiber foods Miralax (over the counter) for constipation as needed.    Itching:  If you experience itching with your medications, try taking only a single pain pill, or even half a pain pill at a time.  You may take up to 10 pain pills per day, and you can also use benadryl over the counter for itching or also to help with sleep.   Precautions:  If you experience chest pain or shortness of breath - call 911 immediately for transfer to the hospital emergency department!!  If you develop a fever greater that 101 F, purulent drainage from wound, increased redness or drainage from wound, or calf pain-Call [facility]                                               Follow- Up Appointment:  Please call for an appointment to be seen in 2 weeks at [facility]   POSTERIOR TOTAL HIP REPLACEMENT POSTOPERATIVE DIRECTIONS  Hip Rehabilitation, Guidelines Following Surgery  The results of a hip operation are greatly improved after range of motion and muscle strengthening exercises. Follow all safety measures which are given to protect your hip. If any of these exercises cause increased pain or swelling in your joint, decrease the amount until you are comfortable again. Then slowly increase the exercises.  Call your caregiver if you have problems or questions.   HOME CARE INSTRUCTIONS  Remove items at home which could result in a fall. This includes throw rugs or furniture in walking pathways.   ICE to the affected hip every three hours for 30 minutes at a time and then as needed for pain and swelling.  Continue to use ice on the hip for pain and swelling from surgery. You may notice swelling that will progress down to the foot and ankle.  This is normal after surgery.  Elevate the leg when you are not up walking on it.    Continue to use the breathing machine which will help keep your temperature down.  It is common for your temperature to cycle up and down following surgery, especially at night when you are not up moving around and exerting yourself.  The breathing machine keeps your lungs expanded and your temperature down.  DIET You may resume your previous home diet once your are discharged from the hospital.  DRESSING / WOUND  CARE / SHOWERING  DO NOT GET THE INCISION WET You may start showering once staples have been removed at 2 weeks. Change dressing as needed. Do not submerge the incision in water such as a bath tub, swimming pool  or hot tubs until the incision is completely healed which is approximately 4 weeks.   ACTIVITY Walk with your walker as instructed. Use walker as long as suggested by your caregivers.May go to using a cane once your therapist feels that it is safe to do so. Avoid periods of inactivity such as sitting longer than an hour when not asleep. This helps prevent blood clots.  You may resume a sexual relationship in one month or when given the OK by your doctor.  You may return to work once you are cleared by your doctor.  Do not drive a car for 6 weeks or until released by you surgeon.  Do not drive while taking narcotics.   WEIGHT BEARING You may wait bear as tolerated on the surgical leg.  POSTOPERATIVE CONSTIPATION PROTOCOL Constipation - defined  medically as fewer than three stools per week and severe constipation as less than one stool per week.  One of the most common issues patients have following surgery is constipation.  Even if you have a regular bowel pattern at home, your normal regimen is likely to be disrupted due to multiple reasons following surgery.  Combination of anesthesia, postoperative narcotics, change in appetite and fluid intake all can affect your bowels.  In order to avoid complications following surgery, here are some recommendations in order to help you during your recovery period.  Colace (docusate) - Pick up an over-the-counter form of Colace or another stool softener and take twice a day as long as you are requiring postoperative pain medications.  Take with a full glass of water daily.  If you experience loose stools or diarrhea, hold the colace until you stool forms back up.  If your symptoms do not get better within 1 week or if they get worse, check with your doctor.  Dulcolax (bisacodyl) - Pick up over-the-counter and take as directed by the product packaging as needed to assist with the movement of your bowels.  Take with a full glass of water.  Use this product as needed if not relieved by Colace only.   MiraLax (polyethylene glycol) - Pick up over-the-counter to have on hand.  MiraLax is a solution that will increase the amount of water in your bowels to assist with bowel movements.  Take as directed and can mix with a glass of water, juice, soda, coffee, or tea.  Take if you go more than two days without a movement. Do not use MiraLax more than once per day. Call your doctor if you are still constipated or irregular after using this medication for 7 days in a row.  If you continue to have problems with postoperative constipation, please contact the office for further assistance and recommendations.  If you experience "the worst abdominal pain ever" or develop nausea or vomiting, please contact the office  immediatly for further recommendations for treatment.  ITCHING  If you experience itching with your medications, try taking only a single pain pill, or even half a pain pill at a time.  You can also use Benadryl over the counter for itching or also to help with sleep.   TED HOSE STOCKINGS Wear the elastic stockings on both legs. If you go home, you may remove these at night but will need to put them on the first thing in the morning. If you go to rehab, then you may remove them one hour per 8 hour shift. This is because in rehab you are not as active as you are at home.  MEDICATIONS See your medication summary on the After Visit Summary that the nursing staff will review with you prior to discharge.  You may have some home medications which will be placed on hold until you complete the course of blood thinner medication.  It is important for you to complete the blood thinner medication as prescribed by your surgeon.  Continue your approved medications as instructed at time of discharge.  PRECAUTIONS If you experience chest pain or shortness of breath - call 911 immediately for transfer to the hospital emergency department.  If you develop a fever greater that 101 F, purulent drainage from wound, increased redness or drainage from wound, foul odor from the wound/dressing, or calf pain - CONTACT YOUR SURGEON.                                                   FOLLOW-UP APPOINTMENTS Make sure you keep all of your appointments after your operation with your surgeon and caregivers. You should call the office at the above phone number and make an appointment for approximately 6 weeks after the date of your surgery or on the date instructed by your surgeon outlined in the "After Visit Summary". This appointment should have already been made prior to the surgery. If you don't remember the date and time, call the office at [phone].  RANGE OF MOTION AND STRENGTHENING EXERCISES  These exercises are  designed to help you keep full movement of your hip joint. Follow your caregiver's or physical therapist's instructions. Perform all exercises about fifteen times, three times per day or as directed. Exercise both hips, even if you have had only one joint replacement. These exercises can be done on a training (exercise) mat, on the floor, on a table or on a bed. Use whatever works the best and is most comfortable for you. Use music or television while you are exercising so that the exercises are a pleasant break in your day. This will make your life better with the exercises acting as a break in routine you can look forward to.  Lying on your back, slowly slide your foot toward your buttocks, raising your knee up off the floor. Then slowly slide your foot back down until your leg is straight again.  Lying on your back spread your legs as far apart as you can without causing discomfort.  Lying on your side, raise your upper leg and foot straight up from the floor as far as is comfortable. Slowly lower the leg and repeat.  Lying on your back, tighten up the muscle in the front of your thigh (quadriceps muscles). You can do this by keeping your leg straight and trying to raise your heel off the floor. This helps strengthen the largest muscle supporting your knee.  Lying on your back, tighten up the muscles of your buttocks both with the legs straight and with the knee bent at a comfortable angle while keeping your heel on the floor.   DON'T FORGET THE 4 POSTERIOR HIP PRECAUTIONS   IF YOU ARE TRANSFERRED TO A SKILLED REHAB FACILITY If the patient is transferred to a skilled rehab facility following release from the hospital, a list of the current medications will be sent to the facility for the patient to continue.  When discharged from the skilled rehab facility, please have the facility set up the  patient's Home Health Physical Therapy prior to being released. Also, the skilled facility will be responsible  for providing the patient with their medications at time of release from the facility to include their pain medication, the muscle relaxants, and their blood thinner medication. If the patient is still at the rehab facility at time of the two week follow up appointment, the skilled rehab facility will also need to assist the patient in arranging follow up appointment in our office and any transportation needs.  MAKE SURE YOU:  Understand these instructions.  Get help right away if you are not doing well or get worse.    Pick up stool softner and laxative for home use following surgery while on pain medications. Continue to use ice for pain and swelling after surgery. Do not use any lotions or creams on the incision until instructed by your surgeon.

## 2015-01-08 LAB — SURGICAL PATHOLOGY

## 2015-01-09 DIAGNOSIS — F39 Unspecified mood [affective] disorder: Secondary | ICD-10-CM

## 2015-01-09 DIAGNOSIS — I2510 Atherosclerotic heart disease of native coronary artery without angina pectoris: Secondary | ICD-10-CM | POA: Diagnosis not present

## 2015-01-09 DIAGNOSIS — G3183 Dementia with Lewy bodies: Secondary | ICD-10-CM | POA: Diagnosis not present

## 2015-01-09 DIAGNOSIS — S7292XA Unspecified fracture of left femur, initial encounter for closed fracture: Secondary | ICD-10-CM | POA: Diagnosis not present

## 2015-01-09 DIAGNOSIS — F22 Delusional disorders: Secondary | ICD-10-CM

## 2015-01-09 DIAGNOSIS — G20 Parkinson's disease: Secondary | ICD-10-CM | POA: Diagnosis not present

## 2015-01-21 DIAGNOSIS — S7292XA Unspecified fracture of left femur, initial encounter for closed fracture: Secondary | ICD-10-CM

## 2015-01-21 DIAGNOSIS — J069 Acute upper respiratory infection, unspecified: Secondary | ICD-10-CM

## 2015-02-24 ENCOUNTER — Telehealth: Payer: Self-pay

## 2015-02-24 NOTE — Telephone Encounter (Signed)
PLEASE NOTE: All timestamps contained within this report are represented as [place] Standard Time. CONFIDENTIALTY NOTICE: This fax transmission is intended only for the addressee. It contains information that is legally privileged, confidential or otherwise protected from use or disclosure. If you are not the intended recipient, you are strictly prohibited from reviewing, disclosing, copying using or disseminating any of this information or taking any action in reliance on or regarding this information. If you have received this fax in error, please notify us immediately by telephone so that we can arrange for its return to [place]. Phone: [phone], Toll-Free: [phone], Fax: [phone] Page: 1 of 1 Call Id: [id number] [facility] Night - Client TELEPHONE ADVICE RECORD [facility] Medical Call Center Patient Name: [name] Rich Gender: Male DOB: 10/25/1928 Age: 81 Y 9 M 21 D Return Phone Number: Address: City/State/Zip: [place] [facility] Night - Client Client Site [facility] [place] - Night Physician [name] Contact Type Call Call Type Page Only Caller Name Olegario Messier Relationship To Patient Provider Is this call to report lab results? No Return Phone Number Please choose phone number Initial Comment Caller states Olegario Messier RN [facility]. PT has broken his 5th finger on right hand needs order for an x-ray. CB# [phone] Nurse Assessment Guidelines Guideline Title Affirmed Question Affirmed Notes Nurse Date/Time Lamount Cohen Time) Disp. Time Lamount Cohen Time) Disposition Final User 02/22/2015 8:17:36 AM Send to [facility] Paging Queue Armando Reichert 02/22/2015 8:20:21 AM Paged On Call back to Call Center - PC Ulice Dash 02/22/2015 8:22:34 AM Page Completed Yes Ulice Dash After Care Instructions Given Call Event Type User Date / Time Description Paging DoctorName Phone DateTime Result/Outcome Message Type  Notes [name] [phone] 02/22/2015 8:20:21 AM Paged On Call Back to Call Center Doctor Paged Please call Morrie Sheldon at [phone]. [name] 02/22/2015 8:23:37 AM Paged On Call to Another Provider Message Result connected on-call with facility

## 2015-02-24 NOTE — Telephone Encounter (Signed)
X-ray was negative for fracture °

## 2015-02-26 DIAGNOSIS — F22 Delusional disorders: Secondary | ICD-10-CM

## 2015-02-26 DIAGNOSIS — K219 Gastro-esophageal reflux disease without esophagitis: Secondary | ICD-10-CM | POA: Diagnosis not present

## 2015-02-26 DIAGNOSIS — N400 Enlarged prostate without lower urinary tract symptoms: Secondary | ICD-10-CM | POA: Diagnosis not present

## 2015-02-26 DIAGNOSIS — G20 Parkinson's disease: Secondary | ICD-10-CM

## 2015-02-26 DIAGNOSIS — I2510 Atherosclerotic heart disease of native coronary artery without angina pectoris: Secondary | ICD-10-CM | POA: Diagnosis not present

## 2015-02-26 DIAGNOSIS — G3183 Dementia with Lewy bodies: Secondary | ICD-10-CM

## 2015-02-26 DIAGNOSIS — S7291XA Unspecified fracture of right femur, initial encounter for closed fracture: Secondary | ICD-10-CM | POA: Diagnosis not present

## 2015-03-13 DIAGNOSIS — H6120 Impacted cerumen, unspecified ear: Secondary | ICD-10-CM | POA: Diagnosis not present

## 2015-03-28 DIAGNOSIS — G20 Parkinson's disease: Secondary | ICD-10-CM | POA: Diagnosis not present

## 2015-03-28 DIAGNOSIS — B351 Tinea unguium: Secondary | ICD-10-CM | POA: Diagnosis not present

## 2015-04-24 DIAGNOSIS — I25119 Atherosclerotic heart disease of native coronary artery with unspecified angina pectoris: Secondary | ICD-10-CM | POA: Diagnosis not present

## 2015-04-24 DIAGNOSIS — G3183 Dementia with Lewy bodies: Secondary | ICD-10-CM | POA: Diagnosis not present

## 2015-04-24 DIAGNOSIS — F39 Unspecified mood [affective] disorder: Secondary | ICD-10-CM | POA: Diagnosis not present

## 2015-04-24 DIAGNOSIS — G20 Parkinson's disease: Secondary | ICD-10-CM | POA: Diagnosis not present

## 2015-07-04 DIAGNOSIS — F39 Unspecified mood [affective] disorder: Secondary | ICD-10-CM

## 2015-07-04 DIAGNOSIS — K219 Gastro-esophageal reflux disease without esophagitis: Secondary | ICD-10-CM | POA: Diagnosis not present

## 2015-07-04 DIAGNOSIS — G20 Parkinson's disease: Secondary | ICD-10-CM

## 2015-07-04 DIAGNOSIS — E785 Hyperlipidemia, unspecified: Secondary | ICD-10-CM | POA: Diagnosis not present

## 2015-07-04 DIAGNOSIS — F600 Paranoid personality disorder: Secondary | ICD-10-CM

## 2015-07-04 DIAGNOSIS — I2510 Atherosclerotic heart disease of native coronary artery without angina pectoris: Secondary | ICD-10-CM | POA: Diagnosis not present

## 2015-07-04 DIAGNOSIS — G3183 Dementia with Lewy bodies: Secondary | ICD-10-CM

## 2015-07-04 DIAGNOSIS — N401 Enlarged prostate with lower urinary tract symptoms: Secondary | ICD-10-CM | POA: Diagnosis not present

## 2015-07-15 DIAGNOSIS — M25551 Pain in right hip: Secondary | ICD-10-CM | POA: Diagnosis not present

## 2015-08-28 DIAGNOSIS — I25119 Atherosclerotic heart disease of native coronary artery with unspecified angina pectoris: Secondary | ICD-10-CM

## 2015-08-28 DIAGNOSIS — G3183 Dementia with Lewy bodies: Secondary | ICD-10-CM | POA: Diagnosis not present

## 2015-08-28 DIAGNOSIS — I350 Nonrheumatic aortic (valve) stenosis: Secondary | ICD-10-CM | POA: Diagnosis not present

## 2015-08-28 DIAGNOSIS — F39 Unspecified mood [affective] disorder: Secondary | ICD-10-CM

## 2015-08-28 DIAGNOSIS — G20 Parkinson's disease: Secondary | ICD-10-CM | POA: Diagnosis not present

## 2015-11-12 DIAGNOSIS — I2510 Atherosclerotic heart disease of native coronary artery without angina pectoris: Secondary | ICD-10-CM | POA: Diagnosis not present

## 2015-11-12 DIAGNOSIS — G20 Parkinson's disease: Secondary | ICD-10-CM | POA: Diagnosis not present

## 2015-11-12 DIAGNOSIS — F39 Unspecified mood [affective] disorder: Secondary | ICD-10-CM

## 2015-11-12 DIAGNOSIS — I350 Nonrheumatic aortic (valve) stenosis: Secondary | ICD-10-CM

## 2015-11-12 DIAGNOSIS — K219 Gastro-esophageal reflux disease without esophagitis: Secondary | ICD-10-CM | POA: Diagnosis not present

## 2015-11-12 DIAGNOSIS — F0390 Unspecified dementia without behavioral disturbance: Secondary | ICD-10-CM

## 2015-11-12 DIAGNOSIS — N401 Enlarged prostate with lower urinary tract symptoms: Secondary | ICD-10-CM | POA: Diagnosis not present

## 2015-11-27 DIAGNOSIS — L219 Seborrheic dermatitis, unspecified: Secondary | ICD-10-CM | POA: Diagnosis not present

## 2015-12-25 DIAGNOSIS — F39 Unspecified mood [affective] disorder: Secondary | ICD-10-CM

## 2015-12-25 DIAGNOSIS — G20 Parkinson's disease: Secondary | ICD-10-CM | POA: Diagnosis not present

## 2015-12-25 DIAGNOSIS — I358 Other nonrheumatic aortic valve disorders: Secondary | ICD-10-CM | POA: Diagnosis not present

## 2015-12-25 DIAGNOSIS — I25119 Atherosclerotic heart disease of native coronary artery with unspecified angina pectoris: Secondary | ICD-10-CM | POA: Diagnosis not present

## 2015-12-25 DIAGNOSIS — G3183 Dementia with Lewy bodies: Secondary | ICD-10-CM | POA: Diagnosis not present

## 2016-02-20 DIAGNOSIS — G20 Parkinson's disease: Secondary | ICD-10-CM | POA: Diagnosis not present

## 2016-02-20 DIAGNOSIS — N401 Enlarged prostate with lower urinary tract symptoms: Secondary | ICD-10-CM | POA: Diagnosis not present

## 2016-02-20 DIAGNOSIS — K219 Gastro-esophageal reflux disease without esophagitis: Secondary | ICD-10-CM | POA: Diagnosis not present

## 2016-02-20 DIAGNOSIS — I2510 Atherosclerotic heart disease of native coronary artery without angina pectoris: Secondary | ICD-10-CM

## 2016-02-20 DIAGNOSIS — G3183 Dementia with Lewy bodies: Secondary | ICD-10-CM

## 2016-02-20 DIAGNOSIS — F39 Unspecified mood [affective] disorder: Secondary | ICD-10-CM

## 2016-04-22 DIAGNOSIS — I25119 Atherosclerotic heart disease of native coronary artery with unspecified angina pectoris: Secondary | ICD-10-CM | POA: Diagnosis not present

## 2016-04-22 DIAGNOSIS — F39 Unspecified mood [affective] disorder: Secondary | ICD-10-CM

## 2016-04-22 DIAGNOSIS — G20 Parkinson's disease: Secondary | ICD-10-CM | POA: Diagnosis not present

## 2016-04-22 DIAGNOSIS — I350 Nonrheumatic aortic (valve) stenosis: Secondary | ICD-10-CM | POA: Diagnosis not present

## 2016-04-22 DIAGNOSIS — G3183 Dementia with Lewy bodies: Secondary | ICD-10-CM | POA: Diagnosis not present

## 2016-05-28 DIAGNOSIS — R109 Unspecified abdominal pain: Secondary | ICD-10-CM | POA: Diagnosis not present

## 2016-05-28 DIAGNOSIS — G20 Parkinson's disease: Secondary | ICD-10-CM | POA: Diagnosis not present

## 2016-06-24 DIAGNOSIS — M1991 Primary osteoarthritis, unspecified site: Secondary | ICD-10-CM

## 2016-06-24 DIAGNOSIS — J449 Chronic obstructive pulmonary disease, unspecified: Secondary | ICD-10-CM

## 2016-06-24 DIAGNOSIS — G35 Multiple sclerosis: Secondary | ICD-10-CM

## 2016-06-24 DIAGNOSIS — G629 Polyneuropathy, unspecified: Secondary | ICD-10-CM

## 2016-06-24 DIAGNOSIS — S82302K Unspecified fracture of lower end of left tibia, subsequent encounter for closed fracture with nonunion: Secondary | ICD-10-CM | POA: Diagnosis not present

## 2016-06-24 DIAGNOSIS — M797 Fibromyalgia: Secondary | ICD-10-CM | POA: Diagnosis not present

## 2016-07-07 DIAGNOSIS — G3183 Dementia with Lewy bodies: Secondary | ICD-10-CM

## 2016-07-07 DIAGNOSIS — K219 Gastro-esophageal reflux disease without esophagitis: Secondary | ICD-10-CM | POA: Diagnosis not present

## 2016-07-07 DIAGNOSIS — I2510 Atherosclerotic heart disease of native coronary artery without angina pectoris: Secondary | ICD-10-CM

## 2016-07-07 DIAGNOSIS — E785 Hyperlipidemia, unspecified: Secondary | ICD-10-CM | POA: Diagnosis not present

## 2016-07-07 DIAGNOSIS — F0630 Mood disorder due to known physiological condition, unspecified: Secondary | ICD-10-CM

## 2016-07-07 DIAGNOSIS — I359 Nonrheumatic aortic valve disorder, unspecified: Secondary | ICD-10-CM

## 2016-07-07 DIAGNOSIS — N401 Enlarged prostate with lower urinary tract symptoms: Secondary | ICD-10-CM | POA: Diagnosis not present

## 2016-07-14 DIAGNOSIS — G20 Parkinson's disease: Secondary | ICD-10-CM

## 2016-07-14 DIAGNOSIS — F0280 Dementia in other diseases classified elsewhere without behavioral disturbance: Secondary | ICD-10-CM

## 2016-07-19 DEATH — deceased

## 2016-07-20 ENCOUNTER — Telehealth: Payer: Self-pay | Admitting: Internal Medicine

## 2016-07-20 NOTE — Telephone Encounter (Signed)
Called wife to give my condolences Death certificate done

## 2016-07-20 NOTE — Telephone Encounter (Signed)
Patient Name: Danell Akhter Gender: Male DOB: Jun 05, 1929 Age: 83 Y 2 M 15 D Return Phone Number: [phone] (Primary) Address: City/State/Zip: [place] Client [place] [facility] Night - Client Client Site [facility] [place] - Night Physician [name] - MD Contact Type Call Who Is Calling Patient / Member / Family / Caregiver Call Type Triage / Clinical Caller Name [facility] Relationship To Patient Care Giver Return Phone Number Please choose phone number Chief Complaint DEATH - has occurred or is imminent or pending Reason for Call Symptomatic / Request for Health Information Initial Comment Caller with [facility] needs to report pt death. Translation No Nurse Assessment Nurse: Debera Lat, RN, Tinnie Gens Date/Time Lamount Cohen Time): Aug 13, 2016 8:02:34 AM Confirm and document reason for call. If symptomatic, describe symptoms. ---[facility]  needs to [facility] pt death. Patient expired at 4:15 a.m. this morning. Does the patient have any new or worsening symptoms? ---No Please document clinical information provided and list any resource used. ---Advised caller that I would fax report to office. Guidelines Guideline Title Affirmed Question Affirmed Notes Nurse Date/Time (Eastern Time) Disp. Time Lamount Cohen Time) Disposition Final User 2016-08-13 8:00:23 AM Send to [facility] 08/13/16 8:03:39 AM Clinical Call Yes Debera Lat, RN, Tinnie Gens

## 2020-02-15 ENCOUNTER — Telehealth: Payer: Self-pay

## 2020-02-15 NOTE — Telephone Encounter (Addendum)
Please disregard the below documentation.  This patient was never followed by me or our office.  This patient's chart was incorrectly sent to our office. This patient appears to have passed away back in 10-31-2015.  The notification for the recent patient who has passed, has now correctly been identified, and has already been documented in the correct patient chart.  Saralyn Pilar, DO [facility] 02/15/2020, 12:33 PM

## 2020-02-15 NOTE — Telephone Encounter (Signed)
Copied from CRM [id number]. Topic: General - Inquiry >> Feb 15, 2020  8:30 AM Deborha Payment wrote: Reason for CRM: Vickie from [facility] called letting Dr.K know that patient passed away this morning around 1:21am.  Call back [phone]
